# Patient Record
Sex: Male | Born: 1970 | Race: White | Hispanic: No | Marital: Married | Smoking: Never smoker
Health system: Southern US, Community
[De-identification: ages and names within clinical notes are randomized; demographics above are authoritative.]

## PROBLEM LIST (undated history)

## (undated) DIAGNOSIS — G43909 Migraine, unspecified, not intractable, without status migrainosus: Secondary | ICD-10-CM

## (undated) DIAGNOSIS — M5136 Other intervertebral disc degeneration, lumbar region: Secondary | ICD-10-CM

## (undated) DIAGNOSIS — J302 Other seasonal allergic rhinitis: Secondary | ICD-10-CM

## (undated) DIAGNOSIS — K420 Umbilical hernia with obstruction, without gangrene: Secondary | ICD-10-CM

## (undated) DIAGNOSIS — J40 Bronchitis, not specified as acute or chronic: Secondary | ICD-10-CM

## (undated) HISTORY — PX: SHOULDER SURGERY: SHX246

## (undated) HISTORY — DX: Migraine, unspecified, not intractable, without status migrainosus: G43.909

## (undated) HISTORY — DX: Bronchitis, not specified as acute or chronic: J40

## (undated) HISTORY — PX: HAND SURGERY: SHX662

## (undated) HISTORY — PX: ELBOW SURGERY: SHX618

## (undated) HISTORY — DX: Other seasonal allergic rhinitis: J30.2

---

## 1998-05-29 ENCOUNTER — Ambulatory Visit (HOSPITAL_COMMUNITY): Admission: RE | Admit: 1998-05-29 | Discharge: 1998-05-29 | Payer: Self-pay | Admitting: Family Medicine

## 1999-03-09 ENCOUNTER — Emergency Department (HOSPITAL_COMMUNITY): Admission: EM | Admit: 1999-03-09 | Discharge: 1999-03-09 | Payer: Self-pay | Admitting: Emergency Medicine

## 2007-02-27 ENCOUNTER — Emergency Department (HOSPITAL_COMMUNITY): Admission: EM | Admit: 2007-02-27 | Discharge: 2007-02-27 | Payer: Self-pay | Admitting: Emergency Medicine

## 2010-09-03 ENCOUNTER — Ambulatory Visit: Payer: Self-pay | Admitting: Vascular Surgery

## 2010-09-03 ENCOUNTER — Ambulatory Visit
Admission: RE | Admit: 2010-09-03 | Discharge: 2010-09-03 | Payer: Self-pay | Source: Home / Self Care | Admitting: Orthopedic Surgery

## 2014-08-01 ENCOUNTER — Encounter: Payer: Self-pay | Admitting: *Deleted

## 2014-08-01 DIAGNOSIS — J40 Bronchitis, not specified as acute or chronic: Secondary | ICD-10-CM | POA: Insufficient documentation

## 2017-07-22 ENCOUNTER — Other Ambulatory Visit: Payer: Self-pay | Admitting: Family Medicine

## 2017-07-22 ENCOUNTER — Ambulatory Visit
Admission: RE | Admit: 2017-07-22 | Discharge: 2017-07-22 | Disposition: A | Discharge status: Home / Self Care | Payer: BC Managed Care – PPO | Source: Ambulatory Visit | Attending: Family Medicine | Admitting: Family Medicine

## 2017-07-22 DIAGNOSIS — R109 Unspecified abdominal pain: Secondary | ICD-10-CM

## 2017-07-22 MED ORDER — IOPAMIDOL (ISOVUE-300) INJECTION 61%
125.0000 mL | Freq: Once | INTRAVENOUS | Status: AC | PRN
  Administered 2017-07-22: 125 mL via INTRAVENOUS

## 2017-08-31 ENCOUNTER — Ambulatory Visit
Admission: RE | Admit: 2017-08-31 | Discharge: 2017-08-31 | Disposition: A | Discharge status: Home / Self Care | Payer: BC Managed Care – PPO | Source: Ambulatory Visit | Attending: Family Medicine | Admitting: Family Medicine

## 2017-08-31 ENCOUNTER — Other Ambulatory Visit: Payer: Self-pay | Admitting: Family Medicine

## 2017-08-31 DIAGNOSIS — S20219A Contusion of unspecified front wall of thorax, initial encounter: Secondary | ICD-10-CM

## 2017-11-12 ENCOUNTER — Other Ambulatory Visit: Payer: Self-pay | Admitting: Sports Medicine

## 2017-11-12 DIAGNOSIS — M545 Low back pain: Secondary | ICD-10-CM

## 2017-11-12 DIAGNOSIS — M479 Spondylosis, unspecified: Secondary | ICD-10-CM

## 2017-11-16 ENCOUNTER — Ambulatory Visit
Admission: RE | Admit: 2017-11-16 | Discharge: 2017-11-16 | Disposition: A | Discharge status: Home / Self Care | Payer: BC Managed Care – PPO | Source: Ambulatory Visit | Attending: Sports Medicine | Admitting: Sports Medicine

## 2017-11-16 DIAGNOSIS — M545 Low back pain: Secondary | ICD-10-CM

## 2017-11-16 DIAGNOSIS — M479 Spondylosis, unspecified: Secondary | ICD-10-CM

## 2018-07-30 ENCOUNTER — Emergency Department (HOSPITAL_COMMUNITY)
Admission: EM | Admit: 2018-07-30 | Discharge: 2018-07-30 | Disposition: A | Discharge status: Home / Self Care | Payer: BC Managed Care – PPO | Attending: Emergency Medicine | Admitting: Emergency Medicine

## 2018-07-30 ENCOUNTER — Emergency Department (HOSPITAL_COMMUNITY): Payer: BC Managed Care – PPO

## 2018-07-30 ENCOUNTER — Encounter (HOSPITAL_COMMUNITY): Payer: Self-pay

## 2018-07-30 ENCOUNTER — Other Ambulatory Visit: Payer: Self-pay

## 2018-07-30 DIAGNOSIS — K429 Umbilical hernia without obstruction or gangrene: Secondary | ICD-10-CM | POA: Diagnosis not present

## 2018-07-30 HISTORY — DX: Other intervertebral disc degeneration, lumbar region: M51.36

## 2018-07-30 LAB — COMPREHENSIVE METABOLIC PANEL
ALT: 28 U/L (ref 0–44)
AST: 17 U/L (ref 15–41)
Albumin: 4.1 g/dL (ref 3.5–5.0)
Alkaline Phosphatase: 63 U/L (ref 38–126)
Anion gap: 9 (ref 5–15)
BILIRUBIN TOTAL: 1.3 mg/dL — AB (ref 0.3–1.2)
BUN: 18 mg/dL (ref 6–20)
CO2: 25 mmol/L (ref 22–32)
CREATININE: 0.89 mg/dL (ref 0.61–1.24)
Calcium: 9 mg/dL (ref 8.9–10.3)
Chloride: 108 mmol/L (ref 98–111)
Glucose, Bld: 91 mg/dL (ref 70–99)
Potassium: 4 mmol/L (ref 3.5–5.1)
Sodium: 142 mmol/L (ref 135–145)
TOTAL PROTEIN: 7.1 g/dL (ref 6.5–8.1)

## 2018-07-30 LAB — CBC
HCT: 46 % (ref 39.0–52.0)
Hemoglobin: 15.4 g/dL (ref 13.0–17.0)
MCH: 31.2 pg (ref 26.0–34.0)
MCHC: 33.5 g/dL (ref 30.0–36.0)
MCV: 93.3 fL (ref 80.0–100.0)
NRBC: 0 % (ref 0.0–0.2)
Platelets: 249 10*3/uL (ref 150–400)
RBC: 4.93 MIL/uL (ref 4.22–5.81)
RDW: 12.2 % (ref 11.5–15.5)
WBC: 12.9 10*3/uL — AB (ref 4.0–10.5)

## 2018-07-30 LAB — LIPASE, BLOOD: LIPASE: 43 U/L (ref 11–51)

## 2018-07-30 MED ORDER — SODIUM CHLORIDE 0.9 % IJ SOLN
INTRAMUSCULAR | Status: AC
  Filled 2018-07-30: qty 50

## 2018-07-30 MED ORDER — IOPAMIDOL (ISOVUE-300) INJECTION 61%
INTRAVENOUS | Status: AC
  Filled 2018-07-30: qty 100

## 2018-07-30 MED ORDER — IOPAMIDOL (ISOVUE-300) INJECTION 61%
100.0000 mL | Freq: Once | INTRAVENOUS | Status: AC | PRN
  Administered 2018-07-30: 100 mL via INTRAVENOUS

## 2018-07-30 MED ORDER — HYDROMORPHONE HCL 1 MG/ML IJ SOLN
1.0000 mg | Freq: Once | INTRAMUSCULAR | Status: AC
  Administered 2018-07-30: 1 mg via INTRAVENOUS
  Filled 2018-07-30: qty 1

## 2018-07-30 MED ORDER — SODIUM CHLORIDE 0.9 % IV SOLN
INTRAVENOUS | Status: DC
  Administered 2018-07-30: 16:00:00 via INTRAVENOUS

## 2018-07-30 MED ORDER — SODIUM CHLORIDE 0.9 % IV BOLUS
1000.0000 mL | Freq: Once | INTRAVENOUS | Status: AC
  Administered 2018-07-30: 1000 mL via INTRAVENOUS

## 2018-07-30 MED ORDER — ONDANSETRON HCL 4 MG/2ML IJ SOLN
4.0000 mg | Freq: Once | INTRAMUSCULAR | Status: AC
  Administered 2018-07-30: 4 mg via INTRAVENOUS
  Filled 2018-07-30: qty 2

## 2018-07-30 MED ORDER — DOXYCYCLINE HYCLATE 100 MG PO TABS
100.0000 mg | ORAL_TABLET | Freq: Once | ORAL | Status: AC
  Administered 2018-07-30: 100 mg via ORAL
  Filled 2018-07-30: qty 1

## 2018-07-30 MED ORDER — HYDROCODONE-ACETAMINOPHEN 5-325 MG PO TABS: 1.0000 | ORAL_TABLET | Freq: Four times a day (QID) | ORAL | 0 refills | Status: DC | PRN

## 2018-07-30 MED ORDER — DOXYCYCLINE HYCLATE 100 MG PO CAPS: 100.0000 mg | ORAL_CAPSULE | Freq: Two times a day (BID) | ORAL | 0 refills | Status: DC

## 2018-07-30 NOTE — ED Notes (Signed)
Pt reporst abd/umbillical region x 2 days. 8/10 sharp pain. Pt was seen by PCP today and was r/o strangulated hernia. Area is tender to touch, pt is guared, and redness is noted surrounding umbilical. Pt escorted with spouse.

## 2018-07-30 NOTE — ED Notes (Signed)
Patient transported to CT, unable to obtain vitals 

## 2018-07-30 NOTE — Discharge Instructions (Signed)
Take the antibiotic doxycycline as directed.  Call on Monday to follow-up with [facility].  Take pain medicine as needed.  Return for fevers persistent vomiting or spreading of the redness.

## 2018-07-30 NOTE — ED Notes (Signed)
Pt made aware urine specimen is needed.  

## 2018-07-30 NOTE — ED Triage Notes (Signed)
Patient c/o mid abdominal pain and went to see PCP today. Patient ws told to come to the ED for CT scan to rule out possible acute strangulated hernia x 2 days. Patient denies any N/V.

## 2018-07-30 NOTE — ED Notes (Signed)
Pt would like to know if his IV needs to be restarted

## 2018-07-30 NOTE — ED Provider Notes (Signed)
[facility]-EMERGENCY DEPT Provider Note   CSN: [id number] Arrival date & time: 07/30/18  1420     History   Chief Complaint Chief Complaint  Patient presents with  . Abdominal Pain    HPI Howard Miller is a 47 y.o. male.  Patient followed by [facility] physicians.  Was seen earlier today noted to have what was felt to be an periumbilical hernia with redness surrounding it.  Symptoms started about 1 week ago.  Redness just started yesterday or perhaps Wednesday.  No nausea or vomiting no fevers.  Discomfort certainly increased yesterday.     Past Medical History:  Diagnosis Date  . Bronchitis   . DDD (degenerative disc disease), lumbar   . Migraines   . Seasonal allergies     Patient Active Problem List   Diagnosis Date Noted  . Bronchitis     Past Surgical History:  Procedure Laterality Date  . ELBOW SURGERY Left   . HAND SURGERY Right   . SHOULDER SURGERY Bilateral         Home Medications    Prior to Admission medications   Medication Sig Start Date End Date Taking? Authorizing Provider  acetaminophen (TYLENOL) 500 MG tablet Take 1,000 mg by mouth every 6 (six) hours as needed for moderate pain.   Yes [provider]  cyclobenzaprine (FLEXERIL) 10 MG tablet Take 10 mg by mouth 3 (three) times daily as needed for muscle spasms.  05/12/18  Yes [provider]  ibuprofen (ADVIL,MOTRIN) 200 MG tablet Take 800 mg by mouth every 6 (six) hours as needed for moderate pain.   Yes [provider]  traMADol (ULTRAM) 50 MG tablet Take 50-100 mg by mouth every 6 (six) hours as needed for moderate pain.  07/02/18  Yes [provider]  albuterol (PROVENTIL) (2.5 MG/3ML) 0.083% nebulizer solution Take 2.5 mg by nebulization 4 (four) times daily as needed for wheezing or shortness of breath.    [provider]  doxycycline (VIBRAMYCIN) 100 MG capsule Take 1 capsule (100 mg total) by mouth 2 (two) times daily.  07/30/18   Vanetta Mulders, MD  HYDROcodone-acetaminophen (NORCO/VICODIN) 5-325 MG tablet Take 1-2 tablets by mouth every 6 (six) hours as needed for moderate pain. 07/30/18   Vanetta Mulders, MD    Family History Family History  Family history unknown: Yes    Social History Social History   Tobacco Use  . Smoking status: Never Smoker  . Smokeless tobacco: Former Engineer, water Use Topics  . Alcohol use: Yes  . Drug use: No     Allergies   Patient has no known allergies.   Review of Systems Review of Systems  Constitutional: Negative for fever.  HENT: Negative for congestion.   Eyes: Negative for redness and visual disturbance.  Respiratory: Negative for shortness of breath.   Cardiovascular: Negative for chest pain.  Gastrointestinal: Positive for abdominal pain. Negative for diarrhea, nausea and vomiting.  Genitourinary: Negative for dysuria.  Musculoskeletal: Negative for back pain.  Skin: Positive for rash.  Allergic/Immunologic: Negative for immunocompromised state.  Neurological: Negative for syncope.  Hematological: Does not bruise/bleed easily.  Psychiatric/Behavioral: Negative for confusion.     Physical Exam Updated Vital Signs BP 127/76   Pulse 72   Temp 97.9 F (36.6 C) (Oral)   Resp 17   Ht 1.854 m (6\' 1" )   Wt (!) 142.9 kg   SpO2 95%   BMI 41.56 kg/m   Physical Exam  Constitutional: He  appears well-developed and well-nourished. No distress.  HENT:  Head: Normocephalic and atraumatic.  Mouth/Throat: Oropharynx is clear and moist.  Eyes: Pupils are equal, round, and reactive to light. EOM are normal. No scleral icterus.  Cardiovascular: Normal rate and normal heart sounds.  Pulmonary/Chest: Effort normal and breath sounds normal. No respiratory distress.  Abdominal: Soft. He exhibits mass. There is tenderness. A hernia is present.  Patient with swelling around the periumbilical area there appears to be a firm mass at the umbilicus there  is an area of surrounding erythema about 6 cm.  It is spherical in nature.  Nursing note and vitals reviewed.    ED Treatments / Results  Labs (all labs ordered are listed, but only abnormal results are displayed) Labs Reviewed  COMPREHENSIVE METABOLIC PANEL - Abnormal; Notable for the following components:      Result Value   Total Bilirubin 1.3 (*)    All other components within normal limits  CBC - Abnormal; Notable for the following components:   WBC 12.9 (*)    All other components within normal limits  LIPASE, BLOOD  URINALYSIS, ROUTINE W REFLEX MICROSCOPIC    EKG None  Radiology Ct Abdomen Pelvis W Contrast  Result Date: 07/30/2018 CLINICAL DATA:  RIGHT LOWER QUADRANT pain for 2 days. EXAM: CT ABDOMEN AND PELVIS WITH CONTRAST TECHNIQUE: Multidetector CT imaging of the abdomen and pelvis was performed using the standard protocol following bolus administration of intravenous contrast. CONTRAST:  ISOVUE-300 IOPAMIDOL (ISOVUE-300) INJECTION 61% COMPARISON:  CT of the abdomen and pelvis on 07/22/2017 FINDINGS: Lower chest: Heart size is normal. No pericardial effusion or significant coronary artery calcifications. No pulmonary nodules, pleural effusions, or infiltrates. Hepatobiliary: The liver is diffusely low attenuation. No focal liver lesion. Gallbladder is present. Pancreas: Unremarkable. No pancreatic ductal dilatation or surrounding inflammatory changes. Spleen: Normal in size without focal abnormality. Adrenals/Urinary Tract: The adrenal glands are normal in appearance. Stable small cyst in the LOWER pole of the LEFT kidney, measuring 8 millimeters. No hydronephrosis. The ureters are unremarkable. The bladder and visualized portion of the urethra are normal. Stomach/Bowel: The stomach and small bowel loops are normal in appearance. Loops of colon are normal in appearance. The appendix is well seen and has a normal appearance. Vascular/Lymphatic: No significant vascular  findings are present. No enlarged abdominal or pelvic lymph nodes. Reproductive: Prostate is unremarkable. Other: Fat containing paraumbilical hernia is larger compared to prior study, now measuring 4.6 x 6.5 centimeters. There is new inflammatory change of the herniated mesenteric fat. No associated bowel loop herniation. Musculoskeletal: Degenerative changes are seen in the thoracolumbar spine. No suspicious lytic or blastic lesions are identified. IMPRESSION: 1. Inflamed herniated mesenteric fat in the paraumbilical region, new since prior studies. Herniated fat measures 4.6 x 6.5 centimeters. No associated bowel obstruction or herniated bowel. 2. Hepatic steatosis. 3. Normal appendix. Electronically Signed   By: Norva Pavlov M.D.   On: 07/30/2018 17:11    Procedures Procedures (including critical care time)  Medications Ordered in ED Medications  0.9 %  sodium chloride infusion ( Intravenous New Bag/Given 07/30/18 1610)  iopamidol (ISOVUE-300) 61 % injection (has no administration in time range)  sodium chloride 0.9 % injection (has no administration in time range)  sodium chloride 0.9 % bolus 1,000 mL (1,000 mLs Intravenous New Bag/Given 07/30/18 1609)  HYDROmorphone (DILAUDID) injection 1 mg (1 mg Intravenous Given 07/30/18 1606)  ondansetron (ZOFRAN) injection 4 mg (4 mg Intravenous Given 07/30/18 1606)  iopamidol (ISOVUE-300) 61 %  injection 100 mL (100 mLs Intravenous Contrast Given 07/30/18 1645)  doxycycline (VIBRA-TABS) tablet 100 mg (100 mg Oral Given 07/30/18 1940)     Initial Impression / Assessment and Plan / ED Course  I have reviewed the triage vital signs and the nursing notes.  Pertinent labs & imaging results that were available during my care of the patient were reviewed by me and considered in my medical decision making (see chart for details).     CT findings discussed with radiology.  They are certain that is omental fat and not small bowel mesentery that stuck  in the umbilical hernia.  In conversations with Dr. Cliffton Asters as long as it was omental patient can be discharged home on a skin antibiotic they were fine with doxycycline for the next 7 days and they will follow him up in the clinic.  Patient will return for any new or worse symptoms.  No evidence of bowel obstruction.  Final Clinical Impressions(s) / ED Diagnoses   Final diagnoses:  Umbilical hernia without obstruction and without gangrene    ED Discharge Orders         Ordered    doxycycline (VIBRAMYCIN) 100 MG capsule  2 times daily     07/30/18 1946    HYDROcodone-acetaminophen (NORCO/VICODIN) 5-325 MG tablet  Every 6 hours PRN     07/30/18 1949           Vanetta Mulders, MD 07/30/18 2000

## 2018-08-03 ENCOUNTER — Other Ambulatory Visit: Payer: Self-pay | Admitting: Surgery

## 2018-08-04 ENCOUNTER — Other Ambulatory Visit: Payer: Self-pay

## 2018-08-04 ENCOUNTER — Encounter (HOSPITAL_BASED_OUTPATIENT_CLINIC_OR_DEPARTMENT_OTHER): Payer: Self-pay | Admitting: *Deleted

## 2018-08-05 NOTE — H&P (Signed)
Howard Miller Documented: 08/03/2018 1:55 PM Location: [facility] Patient #: [id number] DOB: [birth year] Married / Language: English / Race: White Male   History of Present Illness (Mandrell Vangilder A. Magnus Ivan MD; 08/03/2018 2:13 PM) The patient is a 47 year old male who presents with an umbilical hernia. This patient is sent by the emergency room for symptomatic umbilical hernia. He was seen there on October 11 with an umbilical hernia containing incarcerated omentum. He may have had the hernia for sometime but he only recently noticed it. It may have gotten larger after been constipated. He does not do a lot of heavy lifting. He has had no nausea or vomiting. Because of erythema on the umbilicus, they placed him on antibiotics he reports the erythema resolved. He had a CT scan showing the small fascial defect with a large amount of incarcerated omentum in the hernia and no bowel involvement. He is otherwise without complaints.   Past Surgical History Doristine Devoid, CMA; 08/03/2018 1:55 PM) Oral Surgery  Shoulder Surgery  Bilateral. Vasectomy   Diagnostic Studies History Doristine Devoid, CMA; 08/03/2018 1:55 PM) Colonoscopy  1-5 years ago  Allergies Doristine Devoid, CMA; 08/03/2018 1:55 PM) No Known Drug Allergies [08/03/2018]:  Medication History Doristine Devoid, CMA; 08/03/2018 1:56 PM) Doxycycline Hyclate (100MG  Capsule, Oral) Active. HYDROcodone-Acetaminophen (5-325MG  Tablet, Oral) Active. Medications Reconciled  Social History Doristine Devoid, CMA; 08/03/2018 1:55 PM) Alcohol use  Occasional alcohol use. Caffeine use  Carbonated beverages. No drug use  Tobacco use  Never smoker.  Family History Doristine Devoid, CMA; 08/03/2018 1:55 PM) Arthritis  Mother. Cancer  Mother.  Other Problems Doristine Devoid, CMA; 08/03/2018 1:55 PM) Arthritis  Back Pain  Gastroesophageal Reflux Disease     Review of Systems (Chemira Jones CMA; 08/03/2018 1:55  PM) General Not Present- Appetite Loss, Chills, Fatigue, Fever, Night Sweats, Weight Gain and Weight Loss. Skin Not Present- Change in Wart/Mole, Dryness, Hives, Jaundice, New Lesions, Non-Healing Wounds, Rash and Ulcer. HEENT Not Present- Earache, Hearing Loss, Hoarseness, Nose Bleed, Oral Ulcers, Ringing in the Ears, Seasonal Allergies, Sinus Pain, Sore Throat, Visual Disturbances, Wears glasses/contact lenses and Yellow Eyes. Respiratory Not Present- Bloody sputum, Chronic Cough, Difficulty Breathing, Snoring and Wheezing. Breast Not Present- Breast Mass, Breast Pain, Nipple Discharge and Skin Changes. Cardiovascular Not Present- Chest Pain, Difficulty Breathing Lying Down, Leg Cramps, Palpitations, Rapid Heart Rate, Shortness of Breath and Swelling of Extremities. Gastrointestinal Present- Abdominal Pain. Not Present- Bloating, Bloody Stool, Change in Bowel Habits, Chronic diarrhea, Constipation, Difficulty Swallowing, Excessive gas, Gets full quickly at meals, Hemorrhoids, Indigestion, Nausea, Rectal Pain and Vomiting. Male Genitourinary Not Present- Blood in Urine, Change in Urinary Stream, Frequency, Impotence, Nocturia, Painful Urination, Urgency and Urine Leakage. Musculoskeletal Present- Back Pain, Joint Pain and Joint Stiffness. Not Present- Muscle Pain, Muscle Weakness and Swelling of Extremities. Neurological Not Present- Decreased Memory, Fainting, Headaches, Numbness, Seizures, Tingling, Tremor, Trouble walking and Weakness. Psychiatric Not Present- Anxiety, Bipolar, Change in Sleep Pattern, Depression, Fearful and Frequent crying. Endocrine Not Present- Cold Intolerance, Excessive Hunger, Hair Changes, Heat Intolerance, Hot flashes and New Diabetes. Hematology Not Present- Blood Thinners, Easy Bruising, Excessive bleeding, Gland problems, HIV and Persistent Infections.  Vitals (Chemira Jones CMA; 08/03/2018 1:55 PM) 08/03/2018 1:55 PM Weight: 320.4 lb Height: 73in Body Surface  Area: 2.63 m Body Mass Index: 42.27 kg/m  Pulse: 74 (Regular)  BP: 118/82 (Sitting, Left Arm, Standard)       Physical Exam (Kadee Philyaw A. Magnus Ivan MD; 08/03/2018 2:14 PM) General Mental Status-Alert. General Appearance-Consistent  with stated age. Hydration-Well hydrated. Voice-Normal.  Head and Neck Head-normocephalic, atraumatic with no lesions or palpable masses. Trachea-midline.  Eye Eyeball - Bilateral-Extraocular movements intact. Sclera/Conjunctiva - Bilateral-No scleral icterus.  Chest and Lung Exam Chest and lung exam reveals -quiet, even and easy respiratory effort with no use of accessory muscles and on auscultation, normal breath sounds, no adventitious sounds and normal vocal resonance. Inspection Chest Wall - Normal. Back - normal.  Cardiovascular Cardiovascular examination reveals -normal heart sounds, regular rate and rhythm with no murmurs and normal pedal pulses bilaterally.  Abdomen Inspection Skin - Scar - no surgical scars. Hernias - Ventral - Reducible. Umbilical hernia - Incarcerated. Note: There is a large incarcerated umbilical hernia at the upper edge of the umbilicus. There is no further erythema of the skin. Palpation/Percussion Palpation and Percussion of the abdomen reveal - Soft, Non Tender, No Rebound tenderness, No Rigidity (guarding) and No hepatosplenomegaly. Auscultation Auscultation of the abdomen reveals - Bowel sounds normal.  Neurologic Neurologic evaluation reveals -alert and oriented x 3 with no impairment of recent or remote memory. Mental Status-Normal.  Musculoskeletal Normal Exam - Left-Upper Extremity Strength Normal and Lower Extremity Strength Normal. Normal Exam - Right-Upper Extremity Strength Normal, Lower Extremity Weakness.    Assessment & Plan (Mckinzie Saksa A. Magnus Ivan MD; 08/03/2018 2:15 PM) UMBILICAL HERNIA, INCARCERATED (K42.0) Impression: As this is a symptomatic, incarcerated  umbilical hernia, urgent repair is recommended. I discussed the diagnosis in detail with the patient and his wife. They are eager to proceed with surgery as soon as possible. I discussed the surgical procedure in detail. I discussed the risks which include but not limited to bleeding, infection, use of mesh, injury to surrounding structures, cardiopulmonary issues, DVT, postoperative recovery, etc. They understand and agree to proceed with surgery which will be scheduled ASAP

## 2018-08-05 NOTE — Anesthesia Preprocedure Evaluation (Addendum)
Anesthesia Evaluation  Patient identified by MRN, date of birth, ID band Patient awake    Reviewed: Allergy & Precautions, NPO status , Patient's Chart, lab work & pertinent test results  History of Anesthesia Complications Negative for: history of anesthetic complications  Airway Mallampati: II  TM Distance: >3 FB Neck ROM: Full    Dental no notable dental hx. (+) Dental Advisory Given   Pulmonary neg pulmonary ROS,    Pulmonary exam normal        Cardiovascular negative cardio ROS Normal cardiovascular exam     Neuro/Psych negative neurological ROS     GI/Hepatic negative GI ROS, Neg liver ROS,   Endo/Other  Morbid obesity  Renal/GU negative Renal ROS     Musculoskeletal negative musculoskeletal ROS (+)   Abdominal   Peds  Hematology negative hematology ROS (+)   Anesthesia Other Findings Day of surgery medications reviewed with the patient.  Reproductive/Obstetrics                            Anesthesia Physical Anesthesia Plan  ASA: III  Anesthesia Plan: General   Post-op Pain Management:    Induction: Intravenous  PONV Risk Score and Plan: 3 and Ondansetron, Dexamethasone and Diphenhydramine  Airway Management Planned: LMA  Additional Equipment:   Intra-op Plan:   Post-operative Plan: Extubation in OR  Informed Consent: I have reviewed the patients History and Physical, chart, labs and discussed the procedure including the risks, benefits and alternatives for the proposed anesthesia with the patient or authorized representative who has indicated his/her understanding and acceptance.   Dental advisory given  Plan Discussed with: CRNA, Anesthesiologist and Surgeon  Anesthesia Plan Comments:        Anesthesia Quick Evaluation

## 2018-08-06 ENCOUNTER — Ambulatory Visit (HOSPITAL_BASED_OUTPATIENT_CLINIC_OR_DEPARTMENT_OTHER)
Admission: RE | Admit: 2018-08-06 | Discharge: 2018-08-06 | Disposition: A | Discharge status: Home / Self Care | Payer: BC Managed Care – PPO | Source: Ambulatory Visit | Attending: Surgery | Admitting: Surgery

## 2018-08-06 ENCOUNTER — Ambulatory Visit (HOSPITAL_BASED_OUTPATIENT_CLINIC_OR_DEPARTMENT_OTHER): Payer: BC Managed Care – PPO | Admitting: Anesthesiology

## 2018-08-06 ENCOUNTER — Encounter (HOSPITAL_BASED_OUTPATIENT_CLINIC_OR_DEPARTMENT_OTHER): Payer: Self-pay | Admitting: Anesthesiology

## 2018-08-06 ENCOUNTER — Encounter (HOSPITAL_BASED_OUTPATIENT_CLINIC_OR_DEPARTMENT_OTHER)
Admission: RE | Disposition: A | Discharge status: Home / Self Care | Payer: Self-pay | Source: Ambulatory Visit | Attending: Surgery

## 2018-08-06 ENCOUNTER — Other Ambulatory Visit: Payer: Self-pay

## 2018-08-06 DIAGNOSIS — K420 Umbilical hernia with obstruction, without gangrene: Secondary | ICD-10-CM | POA: Diagnosis present

## 2018-08-06 DIAGNOSIS — Z6841 Body Mass Index (BMI) 40.0 and over, adult: Secondary | ICD-10-CM | POA: Diagnosis not present

## 2018-08-06 HISTORY — PX: UMBILICAL HERNIA REPAIR: SHX196

## 2018-08-06 HISTORY — DX: Umbilical hernia with obstruction, without gangrene: K42.0

## 2018-08-06 HISTORY — PX: INSERTION OF MESH: SHX5868

## 2018-08-06 SURGERY — REPAIR, HERNIA, UMBILICAL, ADULT
Anesthesia: General | Site: Abdomen

## 2018-08-06 MED ORDER — PROPOFOL 500 MG/50ML IV EMUL
INTRAVENOUS | Status: AC
  Filled 2018-08-06: qty 50

## 2018-08-06 MED ORDER — BUPIVACAINE-EPINEPHRINE 0.25% -1:200000 IJ SOLN
INTRAMUSCULAR | Status: AC
  Filled 2018-08-06: qty 1

## 2018-08-06 MED ORDER — CEFAZOLIN SODIUM-DEXTROSE 2-4 GM/100ML-% IV SOLN: 2.0000 g | INTRAVENOUS | Status: DC

## 2018-08-06 MED ORDER — FENTANYL CITRATE (PF) 100 MCG/2ML IJ SOLN
50.0000 ug | INTRAMUSCULAR | Status: DC | PRN
  Administered 2018-08-06: 50 ug via INTRAVENOUS
  Administered 2018-08-06: 100 ug via INTRAVENOUS

## 2018-08-06 MED ORDER — CHLORHEXIDINE GLUCONATE CLOTH 2 % EX PADS: 6.0000 | MEDICATED_PAD | Freq: Once | CUTANEOUS | Status: DC

## 2018-08-06 MED ORDER — ACETAMINOPHEN 500 MG PO TABS
1000.0000 mg | ORAL_TABLET | ORAL | Status: AC
  Administered 2018-08-06: 1000 mg via ORAL

## 2018-08-06 MED ORDER — PROMETHAZINE HCL 25 MG/ML IJ SOLN: 6.2500 mg | INTRAMUSCULAR | Status: DC | PRN

## 2018-08-06 MED ORDER — OXYCODONE HCL 5 MG PO TABS: 5.0000 mg | ORAL_TABLET | Freq: Four times a day (QID) | ORAL | 0 refills | Status: DC | PRN

## 2018-08-06 MED ORDER — DEXAMETHASONE SODIUM PHOSPHATE 10 MG/ML IJ SOLN
INTRAMUSCULAR | Status: AC
  Filled 2018-08-06: qty 1

## 2018-08-06 MED ORDER — FENTANYL CITRATE (PF) 100 MCG/2ML IJ SOLN
INTRAMUSCULAR | Status: AC
  Filled 2018-08-06: qty 2

## 2018-08-06 MED ORDER — CELECOXIB 200 MG PO CAPS
200.0000 mg | ORAL_CAPSULE | ORAL | Status: AC
  Administered 2018-08-06: 200 mg via ORAL

## 2018-08-06 MED ORDER — ACETAMINOPHEN 500 MG PO TABS
ORAL_TABLET | ORAL | Status: AC
  Filled 2018-08-06: qty 2

## 2018-08-06 MED ORDER — LIDOCAINE HCL (PF) 1 % IJ SOLN
INTRAMUSCULAR | Status: AC
  Filled 2018-08-06: qty 30

## 2018-08-06 MED ORDER — OXYCODONE HCL 5 MG PO TABS
5.0000 mg | ORAL_TABLET | Freq: Once | ORAL | Status: AC
  Administered 2018-08-06: 5 mg via ORAL

## 2018-08-06 MED ORDER — DEXAMETHASONE SODIUM PHOSPHATE 4 MG/ML IJ SOLN
INTRAMUSCULAR | Status: DC | PRN
  Administered 2018-08-06: 10 mg via INTRAVENOUS

## 2018-08-06 MED ORDER — LIDOCAINE 2% (20 MG/ML) 5 ML SYRINGE
INTRAMUSCULAR | Status: AC
  Filled 2018-08-06: qty 5

## 2018-08-06 MED ORDER — BUPIVACAINE-EPINEPHRINE (PF) 0.25% -1:200000 IJ SOLN
INTRAMUSCULAR | Status: DC | PRN
  Administered 2018-08-06: 20 mL

## 2018-08-06 MED ORDER — GABAPENTIN 300 MG PO CAPS
300.0000 mg | ORAL_CAPSULE | ORAL | Status: AC
  Administered 2018-08-06: 300 mg via ORAL

## 2018-08-06 MED ORDER — OXYCODONE HCL 5 MG PO TABS
ORAL_TABLET | ORAL | Status: AC
  Filled 2018-08-06: qty 1

## 2018-08-06 MED ORDER — FENTANYL CITRATE (PF) 100 MCG/2ML IJ SOLN
25.0000 ug | INTRAMUSCULAR | Status: DC | PRN
  Administered 2018-08-06 (×3): 50 ug via INTRAVENOUS

## 2018-08-06 MED ORDER — CELECOXIB 200 MG PO CAPS
ORAL_CAPSULE | ORAL | Status: AC
  Filled 2018-08-06: qty 1

## 2018-08-06 MED ORDER — LACTATED RINGERS IV SOLN
INTRAVENOUS | Status: DC
  Administered 2018-08-06 (×3): via INTRAVENOUS

## 2018-08-06 MED ORDER — DEXTROSE 5 % IV SOLN
3.0000 g | INTRAVENOUS | Status: AC
  Administered 2018-08-06: 3 g via INTRAVENOUS

## 2018-08-06 MED ORDER — GABAPENTIN 300 MG PO CAPS
ORAL_CAPSULE | ORAL | Status: AC
  Filled 2018-08-06: qty 1

## 2018-08-06 MED ORDER — CEFAZOLIN SODIUM-DEXTROSE 2-4 GM/100ML-% IV SOLN
INTRAVENOUS | Status: AC
  Filled 2018-08-06: qty 100

## 2018-08-06 MED ORDER — ONDANSETRON HCL 4 MG/2ML IJ SOLN
INTRAMUSCULAR | Status: DC | PRN
  Administered 2018-08-06: 4 mg via INTRAVENOUS

## 2018-08-06 MED ORDER — SCOPOLAMINE 1 MG/3DAYS TD PT72: 1.0000 | MEDICATED_PATCH | Freq: Once | TRANSDERMAL | Status: DC | PRN

## 2018-08-06 MED ORDER — PROPOFOL 10 MG/ML IV BOLUS
INTRAVENOUS | Status: DC | PRN
  Administered 2018-08-06: 200 mg via INTRAVENOUS

## 2018-08-06 MED ORDER — CEFAZOLIN SODIUM-DEXTROSE 1-4 GM/50ML-% IV SOLN
INTRAVENOUS | Status: AC
  Filled 2018-08-06: qty 50

## 2018-08-06 MED ORDER — SODIUM BICARBONATE 4 % IV SOLN
INTRAVENOUS | Status: AC
  Filled 2018-08-06: qty 5

## 2018-08-06 MED ORDER — MIDAZOLAM HCL 2 MG/2ML IJ SOLN
INTRAMUSCULAR | Status: AC
  Filled 2018-08-06: qty 2

## 2018-08-06 MED ORDER — LIDOCAINE HCL (CARDIAC) PF 100 MG/5ML IV SOSY
PREFILLED_SYRINGE | INTRAVENOUS | Status: DC | PRN
  Administered 2018-08-06: 100 mg via INTRAVENOUS

## 2018-08-06 MED ORDER — ONDANSETRON HCL 4 MG/2ML IJ SOLN
INTRAMUSCULAR | Status: AC
  Filled 2018-08-06: qty 2

## 2018-08-06 MED ORDER — MIDAZOLAM HCL 2 MG/2ML IJ SOLN
1.0000 mg | INTRAMUSCULAR | Status: DC | PRN
  Administered 2018-08-06: 2 mg via INTRAVENOUS

## 2018-08-06 SURGICAL SUPPLY — 49 items
ADH SKN CLS APL DERMABOND .7 (GAUZE/BANDAGES/DRESSINGS) ×2
BLADE CLIPPER SURG (BLADE) IMPLANT
BLADE HEX COATED 2.75 (ELECTRODE) ×3 IMPLANT
BLADE SURG 15 STRL LF DISP TIS (BLADE) ×1 IMPLANT
BLADE SURG 15 STRL SS (BLADE) ×3
CANISTER SUCT 1200ML W/VALVE (MISCELLANEOUS) IMPLANT
CHLORAPREP W/TINT 26ML (MISCELLANEOUS) ×3 IMPLANT
COVER BACK TABLE 60X90IN (DRAPES) ×3 IMPLANT
COVER MAYO STAND STRL (DRAPES) ×3 IMPLANT
COVER WAND RF STERILE (DRAPES) IMPLANT
DECANTER SPIKE VIAL GLASS SM (MISCELLANEOUS) IMPLANT
DERMABOND ADVANCED (GAUZE/BANDAGES/DRESSINGS) ×4
DERMABOND ADVANCED .7 DNX12 (GAUZE/BANDAGES/DRESSINGS) ×2 IMPLANT
DRAPE LAPAROTOMY 100X72 PEDS (DRAPES) ×3 IMPLANT
DRAPE UTILITY XL STRL (DRAPES) ×3 IMPLANT
DRSG TEGADERM 2-3/8X2-3/4 SM (GAUZE/BANDAGES/DRESSINGS) IMPLANT
ELECT REM PT RETURN 9FT ADLT (ELECTROSURGICAL) ×3
ELECTRODE REM PT RTRN 9FT ADLT (ELECTROSURGICAL) ×1 IMPLANT
GLOVE BIO SURGEON STRL SZ 6.5 (GLOVE) ×1 IMPLANT
GLOVE BIO SURGEONS STRL SZ 6.5 (GLOVE) ×1
GLOVE BIOGEL PI IND STRL 7.0 (GLOVE) IMPLANT
GLOVE BIOGEL PI INDICATOR 7.0 (GLOVE) ×4
GLOVE SURG SIGNA 7.5 PF LTX (GLOVE) ×3 IMPLANT
GOWN STRL REUS W/ TWL LRG LVL3 (GOWN DISPOSABLE) ×1 IMPLANT
GOWN STRL REUS W/ TWL XL LVL3 (GOWN DISPOSABLE) ×1 IMPLANT
GOWN STRL REUS W/TWL LRG LVL3 (GOWN DISPOSABLE) ×3
GOWN STRL REUS W/TWL XL LVL3 (GOWN DISPOSABLE) ×3
MESH VENTRALEX ST 1-7/10 CRC S (Mesh General) ×2 IMPLANT
NDL HYPO 25X1 1.5 SAFETY (NEEDLE) ×1 IMPLANT
NEEDLE HYPO 25X1 1.5 SAFETY (NEEDLE) ×3 IMPLANT
NS IRRIG 1000ML POUR BTL (IV SOLUTION) IMPLANT
PACK BASIN DAY SURGERY FS (CUSTOM PROCEDURE TRAY) ×3 IMPLANT
PENCIL BUTTON HOLSTER BLD 10FT (ELECTRODE) ×3 IMPLANT
SLEEVE SCD COMPRESS KNEE MED (MISCELLANEOUS) ×3 IMPLANT
SPONGE LAP 4X18 RFD (DISPOSABLE) IMPLANT
SUT MNCRL AB 4-0 PS2 18 (SUTURE) ×3 IMPLANT
SUT NOVA 0 T19/GS 22DT (SUTURE) IMPLANT
SUT NOVA NAB DX-16 0-1 5-0 T12 (SUTURE) IMPLANT
SUT NOVA NAB GS-21 1 T12 (SUTURE) ×2 IMPLANT
SUT VIC AB 2-0 SH 27 (SUTURE)
SUT VIC AB 2-0 SH 27XBRD (SUTURE) IMPLANT
SUT VIC AB 3-0 SH 27 (SUTURE) ×3
SUT VIC AB 3-0 SH 27X BRD (SUTURE) ×1 IMPLANT
SYR CONTROL 10ML LL (SYRINGE) ×3 IMPLANT
TOWEL GREEN STERILE FF (TOWEL DISPOSABLE) ×3 IMPLANT
TOWEL OR NON WOVEN STRL DISP B (DISPOSABLE) ×3 IMPLANT
TUBE CONNECTING 20'X1/4 (TUBING)
TUBE CONNECTING 20X1/4 (TUBING) IMPLANT
YANKAUER SUCT BULB TIP NO VENT (SUCTIONS) IMPLANT

## 2018-08-06 NOTE — Anesthesia Postprocedure Evaluation (Signed)
Anesthesia Post Note  Patient: Howard Miller  Procedure(s) Performed: UMBILICAL HERNIA REPAIR WITH MESH (N/A Abdomen) INSERTION OF MESH (N/A Abdomen)     Patient location during evaluation: PACU Anesthesia Type: General Level of consciousness: sedated Pain management: pain level controlled Vital Signs Assessment: post-procedure vital signs reviewed and stable Respiratory status: spontaneous breathing and respiratory function stable Cardiovascular status: stable Postop Assessment: no apparent nausea or vomiting Anesthetic complications: no    Last Vitals:  Vitals:   08/06/18 0845 08/06/18 0913  BP: 135/77 137/85  Pulse: 66 68  Resp: 19 18  Temp:  36.6 C  SpO2: 96% 98%    Last Pain:  Vitals:   08/06/18 0913  TempSrc:   PainSc: 7                  Shaneisha Burkel DANIEL

## 2018-08-06 NOTE — Interval H&P Note (Signed)
History and Physical Interval Note:no change in H and P  08/06/2018 7:06 AM  Howard Miller  has presented today for surgery, with the diagnosis of INCARCERATED UMBILICAL HERNIA  The various methods of treatment have been discussed with the patient and family. After consideration of risks, benefits and other options for treatment, the patient has consented to  Procedure(s): UMBILICAL HERNIA REPAIR WITH MESH (N/A) INSERTION OF MESH (N/A) as a surgical intervention .  The patient's history has been reviewed, patient examined, no change in status, stable for surgery.  I have reviewed the patient's chart and labs.  Questions were answered to the patient's satisfaction.     [name] A

## 2018-08-06 NOTE — Anesthesia Procedure Notes (Signed)
Procedure Name: LMA Insertion Date/Time: 08/06/2018 7:35 AM Performed by: Gar Gibbon, CRNA Pre-anesthesia Checklist: Patient identified, Emergency Drugs available, Suction available and Patient being monitored Patient Re-evaluated:Patient Re-evaluated prior to induction Oxygen Delivery Method: Circle system utilized Preoxygenation: Pre-oxygenation with 100% oxygen Induction Type: IV induction Ventilation: Mask ventilation without difficulty LMA: LMA inserted LMA Size: 5.0 Number of attempts: 1 Airway Equipment and Method: Bite block Placement Confirmation: positive ETCO2 Tube secured with: Tape Dental Injury: Teeth and Oropharynx as per pre-operative assessment

## 2018-08-06 NOTE — Discharge Instructions (Signed)
No Tylenol products until after 1pm today  Post Anesthesia Home Care Instructions  Activity: Get plenty of rest for the remainder of the day. A responsible individual must stay with you for 24 hours following the procedure.  For the next 24 hours, DO NOT: -Drive a car -Advertising copywriter -Drink alcoholic beverages -Take any medication unless instructed by your physician -Make any legal decisions or sign important papers.  Meals: Start with liquid foods such as gelatin or soup. Progress to regular foods as tolerated. Avoid greasy, spicy, heavy foods. If nausea and/or vomiting occur, drink only clear liquids until the nausea and/or vomiting subsides. Call your physician if vomiting continues.  Special Instructions/Symptoms: Your throat may feel dry or sore from the anesthesia or the breathing tube placed in your throat during surgery. If this causes discomfort, gargle with warm salt water. The discomfort should disappear within 24 hours.  If you had a scopolamine patch placed behind your ear for the management of post- operative nausea and/or vomiting:  1. The medication in the patch is effective for 72 hours, after which it should be removed.  Wrap patch in a tissue and discard in the trash. Wash hands thoroughly with soap and water. 2. You may remove the patch earlier than 72 hours if you experience unpleasant side effects which may include dry mouth, dizziness or visual disturbances. 3. Avoid touching the patch. Wash your hands with soap and water after contact with the patch.   CC[facility] _______[facility]  UMBILICAL OR INGUINAL HERNIA REPAIR: POST OP INSTRUCTIONS  Always review your discharge instruction sheet given to you by the facility where your surgery was performed. IF YOU HAVE DISABILITY OR FAMILY LEAVE FORMS, YOU MUST BRING THEM TO THE OFFICE FOR PROCESSING.   DO NOT GIVE THEM TO YOUR DOCTOR.  1. A  prescription for pain medication may be given to you upon  discharge.  Take your pain medication as prescribed, if needed.  If narcotic pain medicine is not needed, then you may take acetaminophen (Tylenol) or ibuprofen (Advil) as needed. 2. Take your usually prescribed medications unless otherwise directed. If you need a refill on your pain medication, please contact your pharmacy.  They will contact our office to request authorization. Prescriptions will not be filled after 5 pm or on week-ends. 3. You should follow a light diet the first 24 hours after arrival home, such as soup and crackers, etc.  Be sure to include lots of fluids daily.  Resume your normal diet the day after surgery. 4.Most patients will experience some swelling and bruising around the umbilicus or in the groin and scrotum.  Ice packs and reclining will help.  Swelling and bruising can take several days to resolve.  6. It is common to experience some constipation if taking pain medication after surgery.  Increasing fluid intake and taking a stool softener (such as Colace) will usually help or prevent this problem from occurring.  A mild laxative (Milk of Magnesia or Miralax) should be taken according to package directions if there are no bowel movements after 48 hours. 7. Unless discharge instructions indicate otherwise, you may remove your bandages 24-48 hours after surgery, and you may shower at that time.  You may have steri-strips (small skin tapes) in place directly over the incision.  These strips should be left on the skin for 7-10 days.  If your surgeon used skin glue on the incision, you may shower in 24 hours.  The glue will flake off over  the next 2-3 weeks.  Any sutures or staples will be removed at the office during your follow-up visit. 8. ACTIVITIES:  You may resume regular (light) daily activities beginning the next day--such as daily self-care, walking, climbing stairs--gradually increasing activities as tolerated.  You may have sexual intercourse when it is comfortable.   Refrain from any heavy lifting or straining until approved by your doctor.  a.You may drive when you are no longer taking prescription pain medication, you can comfortably wear a seatbelt, and you can safely maneuver your car and apply brakes. b.RETURN TO WORK:   _____________________________________________  9.You should see your doctor in the office for a follow-up appointment approximately 2-3 weeks after your surgery.  Make sure that you call for this appointment within a day or two after you arrive home to insure a convenient appointment time. 10.OTHER INSTRUCTIONS: __NO LIFTING MORE THAN 15 TO 20 POUNDS FOR 4 WEEKS ICE PACK, TYLENOL, IBUPROFEN ALSO FOR PAIN OK TO SHOWER STARTING TOMORROW.  OK FOR BATH/HOT TUB NEXT SATURDAY_______________________    _____________________________________  WHEN TO CALL YOUR DOCTOR: 1. Fever over 101.0 2. Inability to urinate 3. Nausea and/or vomiting 4. Extreme swelling or bruising 5. Continued bleeding from incision. 6. Increased pain, redness, or drainage from the incision  The clinic staff is available to answer your questions during regular business hours.  Please dont hesitate to call and ask to speak to one of the nurses for clinical concerns.  If you have a medical emergency, go to the nearest emergency room or call 911.  A surgeon from [facility] is always on call at the hospital   [address], [address], [place], [place]  [address] ?  [address] [address], [place], [place]   [address] [phone] ? [phone] ? FAX [phone] Web site: [email or web address]

## 2018-08-06 NOTE — Op Note (Signed)
UMBILICAL HERNIA REPAIR WITH MESH, INSERTION OF MESH  Procedure Note  Howard Miller 08/06/2018   Pre-op Diagnosis: INCARCERATED UMBILICAL HERNIA     Post-op Diagnosis: same  Procedure(s): UMBILICAL HERNIA REPAIR WITH MESH INSERTION OF MESH  Surgeon(s): Abigail Miyamoto, MD  Anesthesia: General  Staff:  Circulator: Pablo Ledger, RN Scrub Person: Smith Robert, CST  Estimated Blood Loss: Minimal               Findings: The patient was found to have an umbilical hernia with a fascial defect which was approximately 1 cm in size.  Several inches of omentum were incarcerated in the hernia.  The hernia was repaired with a 4.3 cm round Prolene ventral patch  Procedure: The patient was brought to the operating room and identified as the correct patient.  He was placed supine on the operating room table and general anesthesia was induced.  His abdomen was then prepped and draped in usual sterile fashion.  I anesthetized the skin at the upper edge of the umbilicus with Marcaine.  I then made a vertical incision with a scalpel.  I took this to the hernia sac.  It was a large hernia sac containing a moderate amount of omentum.  I excised the sac.  I had to excise part of the omentum and there was able to reduce the rest back into the abdominal cavity.  The actual fascial defect was only 1 cm in size.  A 4.3 cm round ventral patch was brought to the field.  I placed it through the fascial opening and then pulled it up against the peritoneum with the ties.  The mesh was sewn in place circumferentially with interrupted #1 Novafil sutures.  I then cut the ties and closed the fascia over the top of the mesh with a figure-of-eight #1 Novafil suture.  Wide coverage of the fascial defect appeared to be achieved.  I anesthetized the surrounding fascia and subcutaneous tissue with Marcaine.  We irrigated the wound with saline.  I then closed the subcutaneous tissue with interrupted 3-0 Vicryl sutures  and closed the skin with a running 4-0 Monocryl.  Dermabond was then applied.  The patient tolerated the procedure well.  All the counts were correct at the end of the procedure.  The patient was then extubated in the operating room and taken in a stable condition to the recovery room.          Howard Miller A   Date: 08/06/2018  Time: 8:15 AM

## 2018-08-06 NOTE — Transfer of Care (Signed)
Immediate Anesthesia Transfer of Care Note  Patient: Howard Miller  Procedure(s) Performed: UMBILICAL HERNIA REPAIR WITH MESH (N/A Abdomen) INSERTION OF MESH (N/A Abdomen)  Patient Location: PACU  Anesthesia Type:General  Level of Consciousness: awake, sedated and responds to stimulation  Airway & Oxygen Therapy: Patient Spontanous Breathing and Patient connected to face mask oxygen  Post-op Assessment: Report given to RN and Post -op Vital signs reviewed and stable  Post vital signs: Reviewed and stable  Last Vitals:  Vitals Value Taken Time  BP 140/92 08/06/2018  8:17 AM  Temp    Pulse 69 08/06/2018  8:18 AM  Resp 9 08/06/2018  8:18 AM  SpO2 97 % 08/06/2018  8:18 AM  Vitals shown include unvalidated device data.  Last Pain:  Vitals:   08/06/18 0656  TempSrc: Oral         Complications: No apparent anesthesia complications

## 2018-08-09 ENCOUNTER — Encounter (HOSPITAL_BASED_OUTPATIENT_CLINIC_OR_DEPARTMENT_OTHER): Payer: Self-pay | Admitting: Surgery

## 2020-07-16 ENCOUNTER — Emergency Department (HOSPITAL_COMMUNITY): Payer: BC Managed Care – PPO

## 2020-07-16 ENCOUNTER — Inpatient Hospital Stay (HOSPITAL_COMMUNITY)
Admission: EM | Admit: 2020-07-16 | Discharge: 2020-07-19 | DRG: 917 | Disposition: A | Discharge status: Home / Self Care | Payer: BC Managed Care – PPO | Attending: Family Medicine | Admitting: Family Medicine

## 2020-07-16 ENCOUNTER — Encounter (HOSPITAL_COMMUNITY): Payer: Self-pay

## 2020-07-16 ENCOUNTER — Other Ambulatory Visit: Payer: Self-pay

## 2020-07-16 DIAGNOSIS — R7401 Elevation of levels of liver transaminase levels: Secondary | ICD-10-CM | POA: Diagnosis present

## 2020-07-16 DIAGNOSIS — J810 Acute pulmonary edema: Secondary | ICD-10-CM | POA: Diagnosis present

## 2020-07-16 DIAGNOSIS — I10 Essential (primary) hypertension: Secondary | ICD-10-CM | POA: Diagnosis present

## 2020-07-16 DIAGNOSIS — Z6838 Body mass index (BMI) 38.0-38.9, adult: Secondary | ICD-10-CM

## 2020-07-16 DIAGNOSIS — R739 Hyperglycemia, unspecified: Secondary | ICD-10-CM | POA: Diagnosis present

## 2020-07-16 DIAGNOSIS — IMO0002 Reserved for concepts with insufficient information to code with codable children: Secondary | ICD-10-CM | POA: Diagnosis present

## 2020-07-16 DIAGNOSIS — M5136 Other intervertebral disc degeneration, lumbar region: Secondary | ICD-10-CM | POA: Diagnosis present

## 2020-07-16 DIAGNOSIS — T481X1A Poisoning by skeletal muscle relaxants [neuromuscular blocking agents], accidental (unintentional), initial encounter: Secondary | ICD-10-CM | POA: Diagnosis not present

## 2020-07-16 DIAGNOSIS — M6282 Rhabdomyolysis: Secondary | ICD-10-CM | POA: Diagnosis present

## 2020-07-16 DIAGNOSIS — G8929 Other chronic pain: Secondary | ICD-10-CM | POA: Diagnosis present

## 2020-07-16 DIAGNOSIS — R4182 Altered mental status, unspecified: Secondary | ICD-10-CM | POA: Diagnosis not present

## 2020-07-16 DIAGNOSIS — F10959 Alcohol use, unspecified with alcohol-induced psychotic disorder, unspecified: Secondary | ICD-10-CM

## 2020-07-16 DIAGNOSIS — Z23 Encounter for immunization: Secondary | ICD-10-CM

## 2020-07-16 DIAGNOSIS — Z20822 Contact with and (suspected) exposure to covid-19: Secondary | ICD-10-CM | POA: Diagnosis present

## 2020-07-16 DIAGNOSIS — E669 Obesity, unspecified: Secondary | ICD-10-CM | POA: Diagnosis present

## 2020-07-16 DIAGNOSIS — T50901A Poisoning by unspecified drugs, medicaments and biological substances, accidental (unintentional), initial encounter: Secondary | ICD-10-CM | POA: Diagnosis not present

## 2020-07-16 DIAGNOSIS — R339 Retention of urine, unspecified: Secondary | ICD-10-CM | POA: Diagnosis not present

## 2020-07-16 DIAGNOSIS — G92 Toxic encephalopathy: Secondary | ICD-10-CM | POA: Diagnosis present

## 2020-07-16 LAB — CBC WITH DIFFERENTIAL/PLATELET
Abs Immature Granulocytes: 0.14 10*3/uL — ABNORMAL HIGH (ref 0.00–0.07)
Basophils Absolute: 0.1 10*3/uL (ref 0.0–0.1)
Basophils Relative: 0 %
Eosinophils Absolute: 0 10*3/uL (ref 0.0–0.5)
Eosinophils Relative: 0 %
HCT: 46.7 % (ref 39.0–52.0)
Hemoglobin: 15 g/dL (ref 13.0–17.0)
Immature Granulocytes: 1 %
Lymphocytes Relative: 10 %
Lymphs Abs: 2 10*3/uL (ref 0.7–4.0)
MCH: 28.8 pg (ref 26.0–34.0)
MCHC: 32.1 g/dL (ref 30.0–36.0)
MCV: 89.8 fL (ref 80.0–100.0)
Monocytes Absolute: 2.1 10*3/uL — ABNORMAL HIGH (ref 0.1–1.0)
Monocytes Relative: 10 %
Neutro Abs: 16.3 10*3/uL — ABNORMAL HIGH (ref 1.7–7.7)
Neutrophils Relative %: 79 %
Platelets: 266 10*3/uL (ref 150–400)
RBC: 5.2 MIL/uL (ref 4.22–5.81)
RDW: 13.9 % (ref 11.5–15.5)
WBC: 20.6 10*3/uL — ABNORMAL HIGH (ref 4.0–10.5)
nRBC: 0 % (ref 0.0–0.2)

## 2020-07-16 LAB — COMPREHENSIVE METABOLIC PANEL
ALT: 65 U/L — ABNORMAL HIGH (ref 0–44)
AST: 169 U/L — ABNORMAL HIGH (ref 15–41)
Albumin: 3.9 g/dL (ref 3.5–5.0)
Alkaline Phosphatase: 76 U/L (ref 38–126)
Anion gap: 14 (ref 5–15)
BUN: 23 mg/dL — ABNORMAL HIGH (ref 6–20)
CO2: 20 mmol/L — ABNORMAL LOW (ref 22–32)
Calcium: 9.9 mg/dL (ref 8.9–10.3)
Chloride: 103 mmol/L (ref 98–111)
Creatinine, Ser: 1.07 mg/dL (ref 0.61–1.24)
GFR calc Af Amer: >60 mL/min (ref >60)
GFR calc non Af Amer: >60 mL/min (ref >60)
Glucose, Bld: 137 mg/dL — ABNORMAL HIGH (ref 70–99)
Potassium: 4.6 mmol/L (ref 3.5–5.1)
Sodium: 137 mmol/L (ref 135–145)
Total Bilirubin: 1.1 mg/dL (ref 0.3–1.2)
Total Protein: 7.3 g/dL (ref 6.5–8.1)

## 2020-07-16 LAB — I-STAT VENOUS BLOOD GAS, ED
Acid-Base Excess: 0 mmol/L (ref 0.0–2.0)
Bicarbonate: 23.5 mmol/L (ref 20.0–28.0)
Calcium, Ion: 1.17 mmol/L (ref 1.15–1.40)
HCT: 45 % (ref 39.0–52.0)
Hemoglobin: 15.3 g/dL (ref 13.0–17.0)
O2 Saturation: 81 %
Potassium: 4 mmol/L (ref 3.5–5.1)
Sodium: 138 mmol/L (ref 135–145)
TCO2: 25 mmol/L (ref 22–32)
pCO2, Ven: 34.1 mmHg — ABNORMAL LOW (ref 44.0–60.0)
pH, Ven: 7.446 — ABNORMAL HIGH (ref 7.250–7.430)
pO2, Ven: 43 mmHg (ref 32.0–45.0)

## 2020-07-16 LAB — RESPIRATORY PANEL BY RT PCR (FLU A&B, COVID)
Influenza A by PCR: NEGATIVE
Influenza B by PCR: NEGATIVE
SARS Coronavirus 2 by RT PCR: NEGATIVE

## 2020-07-16 LAB — SALICYLATE LEVEL: Salicylate Lvl: <7 mg/dL — ABNORMAL LOW (ref 7.0–30.0)

## 2020-07-16 LAB — ETHANOL: Alcohol, Ethyl (B): <10 mg/dL (ref <10)

## 2020-07-16 LAB — ACETAMINOPHEN LEVEL: Acetaminophen (Tylenol), Serum: <10 ug/mL — ABNORMAL LOW (ref 10–30)

## 2020-07-16 LAB — AMMONIA: Ammonia: 28 umol/L (ref 9–35)

## 2020-07-16 LAB — TSH: TSH: 2.809 u[IU]/mL (ref 0.350–4.500)

## 2020-07-16 LAB — MAGNESIUM: Magnesium: 1.9 mg/dL (ref 1.7–2.4)

## 2020-07-16 MED ORDER — PIPERACILLIN-TAZOBACTAM 3.375 G IVPB 30 MIN
3.3750 g | Freq: Once | INTRAVENOUS | Status: AC
  Administered 2020-07-16: 3.375 g via INTRAVENOUS
  Filled 2020-07-16: qty 50

## 2020-07-16 MED ORDER — VANCOMYCIN HCL 2000 MG/400ML IV SOLN
2000.0000 mg | Freq: Once | INTRAVENOUS | Status: AC
  Administered 2020-07-17: 2000 mg via INTRAVENOUS
  Filled 2020-07-16: qty 400

## 2020-07-16 NOTE — ED Triage Notes (Signed)
Pt brought in for AMS for over 24hrs, last well known was last night. Pt has hx of degenerative disk disease- wife told EMS pt took 4 cyclobenzaprine pills (unknown mg) w alcohol. Per wife- he was still asleep when she left this am for work. Upon returning from work for a few hours, pt was very lethargic, not speaking or answering questions. Pt was mildly combative w EMS. In ED pt opened eyes w sternal rub, but did not answer questions and immediately went back to sleep. GCS 8

## 2020-07-16 NOTE — H&P (Addendum)
Family Medicine Teaching [facility] Admission History and Physical Service Pager: [phone]  Patient name: Howard Miller Medical record number: [id number] Date of birth: June 08, 1971 Age: 49 y.o. Gender: male  Primary Care Provider: Farris Has, MD Consultants: None Code Status: Full which was confirmed by patient's wife if patient unable to confirm. Preferred Emergency Contact: Wife  Chief Complaint: AMS  Assessment and Plan: Howard Miller is a 49 y.o. male presenting with AMS. PMH is significant for Chronic Pain with Degenerative Disc disease.    AMS/Accidental Overdose Patient presented very somnolent like due to overdose of Cyclobenzaprine.  Arousable to deep sternal rub and occasionally to voice.  Gives only occasional one word responses.  Is aware he is in hospital.  Has occasional jerking movements most noticeable in left lower limb.  Patellar eflexes 3+ bilaterally.  Hemodynamically stable.  Satting in 90's on 2L [place].  Likely due to accidnetal Cyclobenzaprine Overdose.  It is difficult to determine the number of cyclobenzaprine patient consumed.  Patient's wife reports she was told for but was unsure of how many were in the bottle prior to him taking the medications.  Concern for aspirational pneumonia given patient's state and difficulty swallowing per wife.  Patient was given Zosyn and vancomycin in the ED due to this.  I think it is more likely that elevated white count is related to neutrophilic demargination.  Ammonia-28.  TSH-nomral.  Ethanol, Acetominophen, Salicylate negative.  Venous blood gas normal.  Need to rule out other drug intoxication. UA Rapid Drug screen pending.  EKG pending.  Head CT negative for any acute intracranial abnormalities.  Brain MRI has been ordered.  Less likely hemmorhagic stroke.  Need to rule out Infarction.    Spoke with poison control in regards to recs for Cyclobenzaprine overdose.   - Admit to telemetry with attending Dr Manson Passey - Continue to  monitor changes in mental status - Continuous caridac monitoring - Continuous Pulse Ox - Per posion control, if QRS>120 give Sodium 1-90mEq per kg as a push.  Repeat EKG after to see if QRS narrowing, continue to f/u with poison cotnrol - Monitor Temp for hyperthermia, cooling measures if neccesary - Monitor urine output, strict I's/O's  - f/u UA, UDS - f/u EKG - f/u MRI -N.p.o. at this time  -Given elevated CK restarting maintenance IV fluids at 125 mL/h -Monitor urine output and complete bladder scans  Concern for Aspirational Pneumonia Wife reports patient had issues with drinking water yesterday.  WBC elevated at 20.  Chest X-Ray shows low volumes and atelectatic change.  Additional features consistent with mild CHF/volume overload with pulmonary congestion, and hazy interstitial opacities suggestive of interstitial edema.  Blood cultures obtained.  Patient was given 1 dose of IV Vanc and Zosyn in the emergency department.  Pulmonary findings were consistent with volume overload/CSF so we will discontinue the IV antibiotics this time. -Discontinue Vanco and Zosyn -Monitor for signs and symptoms of pneumonia -Echocardiogram ordered - f/u BCx  Hypertension Patient has history of hypertension.  Not currently on any medications.  SBP 120-140's. - Continue to monitor BP  Pre-Diabetes Patient's wife indicates patient is Prediabetic.  Not currently on any medications.  Blood glucose on admission was 136.   - Obtain A1C  Transaminitis ALT- 65.  ALT-169.  Elevation coud be due to alcohol use.  Also could be due to Pulmonary congestion. -Repeat Liver labs in AM per poison control to see if improving  FEN/GI: Holding IV fluids given pulmonary congestion Prophylaxis:  Lovenox 70  Disposition: Tele  History of Present Illness:  Howard Miller is a 49 y.o. male presenting with AMS.  Patient minimally responsive to questions.  Spoke with patient's wife on phone.  Indicated previous night  patient was drinking while watching football game.  Indicates patient took flexeril and slept on couch.  When awoke in the morning he was somnolent and had episode of emesis and enuresis overnight.  Wife went to work and returned to eveniong to find patient on different couch but largely somnolent, not very responsive to questioning.  Indicates patient told her he had takne 4 Flexeril night before but unknown in patient took more or my have taken some during afternoon while wife was at work  Patient has chronic pain due to lumbar degenerative disc disease, and usually will occasionally take 1 flexeril at night to help sleep.  Takes tylenol and Ibuprofen occasionally.  Wife indicates back pain has been much worse recently due to long car rides on recent trips.  Sees specialist for "nerve therapy" every 6 months that is very helpful with back pain with last "treatment" in March of this year.   Review Of Systems: Per HPI with the following additions:  Review of Systems  Constitutional: Positive for activity change. Negative for chills and fever.  HENT: Positive for trouble swallowing. Negative for facial swelling.   Respiratory: Negative for cough and choking.   Cardiovascular: Negative for leg swelling.  Gastrointestinal: Positive for vomiting.  Genitourinary: Positive for enuresis.  Musculoskeletal: Positive for back pain.  Skin: Positive for color change.       Wife indicates patient has had this chronically  Neurological: Positive for speech difficulty. Negative for syncope.     Patient Active Problem List   Diagnosis Date Noted  . Bronchitis     Past Medical History: Past Medical History:  Diagnosis Date  . Bronchitis   . DDD (degenerative disc disease), lumbar    low back  . Incarcerated umbilical hernia   . Migraines   . Seasonal allergies     Past Surgical History: Past Surgical History:  Procedure Laterality Date  . ELBOW SURGERY Left   . HAND SURGERY Right   .  INSERTION OF MESH N/A 08/06/2018   Procedure: INSERTION OF MESH;  Surgeon: Abigail Miyamoto, MD;  Location: [facility];  Service: General;  Laterality: N/A;  . SHOULDER SURGERY Bilateral   . UMBILICAL HERNIA REPAIR N/A 08/06/2018   Procedure: UMBILICAL HERNIA REPAIR WITH MESH;  Surgeon: Abigail Miyamoto, MD;  Location: [facility];  Service: General;  Laterality: N/A;    Social History: Social History   Tobacco Use  . Smoking status: Never Smoker  . Smokeless tobacco: Former Clinical biochemist  . Vaping Use: Never used  Substance Use Topics  . Alcohol use: Yes    Comment: social  . Drug use: No   Additional social history:  Please also refer to relevant sections of EMR.  Family History: Family History  Family history unknown: Yes   No family history of any cancer  Allergies and Medications: No Known Allergies No current facility-administered medications on file prior to encounter.   Current Outpatient Medications on File Prior to Encounter  Medication Sig Dispense Refill  . acetaminophen (TYLENOL) 500 MG tablet Take 1,000 mg by mouth every 6 (six) hours as needed for moderate pain.    . cyclobenzaprine (FLEXERIL) 10 MG tablet Take 10 mg by mouth 3 (three) times daily as  needed for muscle spasms.   1  . ibuprofen (ADVIL,MOTRIN) 200 MG tablet Take 800 mg by mouth every 6 (six) hours as needed for moderate pain.      Objective: BP (!) 142/92 (BP Location: Left Arm)   Pulse (!) 112   Temp 98.3 F (36.8 C) (Oral)   Resp (!) 29   Ht 6\' 2"  (1.88 m)   Wt 136.1 kg   SpO2 96%   BMI 38.52 kg/m   Exam: Physical Exam Constitutional:      General: He is not in acute distress.    Appearance: Normal appearance. He is not ill-appearing or diaphoretic.  HENT:     Head: Normocephalic and atraumatic.     Mouth/Throat:     Mouth: Mucous membranes are moist.  Cardiovascular:     Rate and Rhythm: Normal rate and regular rhythm.     Pulses: Normal  pulses.  Pulmonary:     Effort: Pulmonary effort is normal.  Abdominal:     General: Abdomen is flat.     Palpations: Abdomen is soft.  Skin:    General: Skin is warm.     Capillary Refill: Capillary refill takes less than 2 seconds.  Neurological:     Mental Status: He is lethargic.     GCS: GCS eye subscore is 3. GCS verbal subscore is 4. GCS motor subscore is 4.     Deep Tendon Reflexes:     Reflex Scores:      Patellar reflexes are 3+ on the right side and 3+ on the left side.    Comments: Arousable to deep sternal rub and occasionally to voice.  Gives only occasional one word responses.  Is aware he is in hospital.  Has occasional jerking movements most noticeable in left lower limb.      Labs and Imaging: CBC BMET  Recent Labs  Lab 07/16/20 2125 07/16/20 2125 07/16/20 2236  WBC 20.6*  --   --   HGB 15.0   < > 15.3  HCT 46.7   < > 45.0  PLT 266  --   --    < > = values in this interval not displayed.   Recent Labs  Lab 07/16/20 2125 07/16/20 2125 07/16/20 2236  NA 137   < > 138  K 4.6   < > 4.0  CL 103  --   --   CO2 20*  --   --   BUN 23*  --   --   CREATININE 1.07  --   --   GLUCOSE 137*  --   --   CALCIUM 9.9  --   --    < > = values in this interval not displayed.     EKG: Pending  07/18/20, MD 07/16/2020, 11:29 PM PGY-1, [facility] FPTS Intern pager: [phone], text pages welcome  FPTS Upper-Level Resident Addendum   I have independently interviewed and examined the patient. I have discussed the above with the original author and agree with their documentation. My edits for correction/addition/clarification are in blue.Please see also any attending notes.   [phone], MD PGY-2, [facility] 07/17/2020 7:58 AM  FPTS Service pager: [phone] (text pages welcome through [facility])

## 2020-07-16 NOTE — ED Provider Notes (Signed)
[facility] EMERGENCY DEPARTMENT Provider Note   CSN: [id number] Arrival date & time: 07/16/20  2045     History Chief Complaint  Patient presents with  . Altered Mental Status    Howard Miller is a 49 y.o. male with history of chronic pain secondary to degenerative disc disease who presents with altered mental status.  Per his wife, patient had a lot to drink last night and took 4 cyclobenzaprine 10 mg before bed for his back pain.  He usually takes 1 cyclobenzaprine, and even this makes him groggy for a very long time.  He was difficult to wake when she left for work this morning, and on arrival back home, she found he was still extremely sleepy and hard to wake, although he did move from his bed to the couch at some point while she was at work.  She found him with vomit on his face lying on his side.  Denies fever or infectious symptoms prior to this.  She is not aware of any other sedating medications in the house that he has access to.  Blood glucose 130s with EMS.  Patient has frequent twitches and waxing waning mental status, where he will sometimes arouse spontaneously but answers questions with slurred speech, then go back to sleep.   Altered Mental Status Presenting symptoms: lethargy and partial responsiveness   Severity:  Severe Most recent episode:  Today Duration:  1 day Timing:  Constant Progression:  Unchanged Chronicity:  New Context: alcohol use and not taking medications as prescribed   Context: not recent illness        Past Medical History:  Diagnosis Date  . Bronchitis   . DDD (degenerative disc disease), lumbar    low back  . Incarcerated umbilical hernia   . Migraines   . Seasonal allergies     Patient Active Problem List   Diagnosis Date Noted  . Bronchitis     Past Surgical History:  Procedure Laterality Date  . ELBOW SURGERY Left   . HAND SURGERY Right   . INSERTION OF MESH N/A 08/06/2018   Procedure: INSERTION OF MESH;   Surgeon: Abigail Miyamoto, MD;  Location: [facility];  Service: General;  Laterality: N/A;  . SHOULDER SURGERY Bilateral   . UMBILICAL HERNIA REPAIR N/A 08/06/2018   Procedure: UMBILICAL HERNIA REPAIR WITH MESH;  Surgeon: Abigail Miyamoto, MD;  Location: [facility];  Service: General;  Laterality: N/A;       Family History  Family history unknown: Yes    Social History   Tobacco Use  . Smoking status: Never Smoker  . Smokeless tobacco: Former Clinical biochemist  . Vaping Use: Never used  Substance Use Topics  . Alcohol use: Yes    Comment: social  . Drug use: No    Home Medications Prior to Admission medications   Medication Sig Start Date End Date Taking? Authorizing Provider  acetaminophen (TYLENOL) 500 MG tablet Take 1,000 mg by mouth every 6 (six) hours as needed for moderate pain.   Yes [provider]  cyclobenzaprine (FLEXERIL) 10 MG tablet Take 10 mg by mouth 3 (three) times daily as needed for muscle spasms.  05/12/18  Yes [provider]  ibuprofen (ADVIL,MOTRIN) 200 MG tablet Take 800 mg by mouth every 6 (six) hours as needed for moderate pain.   Yes [provider]    Allergies    Patient has no known allergies.  Review of Systems  Review of Systems  Unable to perform ROS: Mental status change    Physical Exam Updated Vital Signs BP (!) 142/92 (BP Location: Left Arm)   Pulse (!) 112   Temp 98.3 F (36.8 C) (Oral)   Resp (!) 29   Ht 6\' 2"  (1.88 m)   Wt 136.1 kg   SpO2 96%   BMI 38.52 kg/m   Physical Exam Constitutional:      General: He is not in acute distress.    Appearance: He is obese.     Comments: Somnolent, occasionally arousing spontaneously.   HENT:     Head: Normocephalic and atraumatic.  Eyes:     Conjunctiva/sclera: Conjunctivae normal.     Pupils: Pupils are equal, round, and reactive to light.  Cardiovascular:     Rate and Rhythm: Regular rhythm. Tachycardia present.      Heart sounds: Normal heart sounds.  Pulmonary:     Effort: Pulmonary effort is normal. Tachypnea present.     Breath sounds: Normal breath sounds.  Abdominal:     Palpations: Abdomen is soft.     Tenderness: There is no abdominal tenderness.  Musculoskeletal:     Cervical back: Neck supple. No rigidity.     Right lower leg: No edema.     Left lower leg: No edema.  Skin:    General: Skin is warm.  Neurological:     Comments: 2+ patellar DTR bilaterally, no clonus. Moves all extremities spontaneously.     ED Results / Procedures / Treatments   Labs (all labs ordered are listed, but only abnormal results are displayed) Labs Reviewed  CBC WITH DIFFERENTIAL/PLATELET - Abnormal; Notable for the following components:      Result Value   WBC 20.6 (*)    Neutro Abs 16.3 (*)    Monocytes Absolute 2.1 (*)    Abs Immature Granulocytes 0.14 (*)    All other components within normal limits  COMPREHENSIVE METABOLIC PANEL - Abnormal; Notable for the following components:   CO2 20 (*)    Glucose, Bld 137 (*)    BUN 23 (*)    AST 169 (*)    ALT 65 (*)    All other components within normal limits  SALICYLATE LEVEL - Abnormal; Notable for the following components:   Salicylate Lvl <7.0 (*)    All other components within normal limits  ACETAMINOPHEN LEVEL - Abnormal; Notable for the following components:   Acetaminophen (Tylenol), Serum <10 (*)    All other components within normal limits  I-STAT VENOUS BLOOD GAS, ED - Abnormal; Notable for the following components:   pH, Ven 7.446 (*)    pCO2, Ven 34.1 (*)    All other components within normal limits  RESPIRATORY PANEL BY RT PCR (FLU A&B, COVID)  CULTURE, BLOOD (ROUTINE X 2)  CULTURE, BLOOD (ROUTINE X 2)  AMMONIA  TSH  ETHANOL  MAGNESIUM  RAPID URINE DRUG SCREEN, HOSP PERFORMED  URINALYSIS, ROUTINE W REFLEX MICROSCOPIC    EKG None  Radiology CT Head Wo Contrast  Result Date: 07/16/2020 CLINICAL DATA:  Altered mental status.  EXAM: CT HEAD WITHOUT CONTRAST TECHNIQUE: Contiguous axial images were obtained from the base of the skull through the vertex without intravenous contrast. COMPARISON:  None. FINDINGS: Brain: No evidence of acute infarction, hemorrhage, hydrocephalus, extra-axial collection or mass lesion/mass effect. Vascular: No hyperdense vessel or unexpected calcification. Skull: Normal. Negative for fracture or focal lesion. Sinuses/Orbits: There is mild to moderate severity right maxillary sinus mucosal thickening.  Other: None. IMPRESSION: 1. No acute intracranial abnormality. 2. Mild to moderate severity right maxillary sinus disease. Electronically Signed   By: Aram Candela M.D.   On: 07/16/2020 22:34   DG Chest Portable 1 View  Result Date: 07/16/2020 CLINICAL DATA:  Altered mental status for over 24 hours, last known well last night EXAM: PORTABLE CHEST 1 VIEW COMPARISON:  Radiograph 08/31/2017 FINDINGS: Low lung volumes. Diffuse hazy interstitial opacities with markedly cephalized indistinct pulmonary vascularity. Prominence of the cardiac silhouette is noted though possibly accentuated by low volumes and portable technique. No pneumothorax or visible effusion. No acute osseous or soft tissue abnormality. Degenerative changes are present in the imaged spine and shoulders. Telemetry leads overlie the chest. IMPRESSION: Low volumes and atelectatic change. Additional features consistent with mild CHF/volume overload with pulmonary congestion, possible cardiomegaly, and hazy interstitial opacities suggestive of interstitial edema. Electronically Signed   By: Kreg Shropshire M.D.   On: 07/16/2020 22:29    Procedures Procedures (including critical care time)  Medications Ordered in ED Medications  piperacillin-tazobactam (ZOSYN) IVPB 3.375 g (has no administration in time range)  vancomycin (VANCOREADY) IVPB 2000 mg/400 mL (has no administration in time range)    ED Course  I have reviewed the triage vital  signs and the nursing notes.  Pertinent labs & imaging results that were available during my care of the patient were reviewed by me and considered in my medical decision making (see chart for details).    MDM Rules/Calculators/A&P                          WBC 20.5 with left shift.  Mild transaminitis, consistent with alcohol use yesterday.  Coingestion labs unremarkable.  TSH, ammonia within normal limits.  Patient is not acidotic or hypercarbic on VBG.  Chest x-ray showed low lung volumes and possible interstitial edema.  Spoke with poison control, no additional testing recommended. Will continue to monitor.   Patient denies headache and neck pain when he wakes up. No fever or neck tenderness. Do not suspect meningitis.   Suspect patient unintentionally overdosed on Flexeril and EtOH and aspirated while somnolent.  CT head unremarkable, but will obtain MRI to rule out any intracranial cause of altered undersurface. Patient is mildly tachypneic, tachycardic, and has a new oxygen requirement of 2 L. Treating for aspiration pneumonia with Zosyn and vancomycin.   Patient admitted for further management.  This patient was seen with Dr. Rush Landmark.  Final Clinical Impression(s) / ED Diagnoses Final diagnoses:  Altered mental status, unspecified altered mental status type    Rx / DC Orders ED Discharge Orders    None       Allayne Butcher, MD 07/16/20 2343    Tegeler, Canary Brim, MD 07/17/20 1046

## 2020-07-17 ENCOUNTER — Observation Stay (HOSPITAL_COMMUNITY): Payer: BC Managed Care – PPO

## 2020-07-17 ENCOUNTER — Emergency Department (HOSPITAL_COMMUNITY): Payer: BC Managed Care – PPO

## 2020-07-17 ENCOUNTER — Other Ambulatory Visit: Payer: Self-pay

## 2020-07-17 DIAGNOSIS — T50901A Poisoning by unspecified drugs, medicaments and biological substances, accidental (unintentional), initial encounter: Secondary | ICD-10-CM | POA: Diagnosis present

## 2020-07-17 DIAGNOSIS — IMO0002 Reserved for concepts with insufficient information to code with codable children: Secondary | ICD-10-CM | POA: Diagnosis present

## 2020-07-17 DIAGNOSIS — F10959 Alcohol use, unspecified with alcohol-induced psychotic disorder, unspecified: Secondary | ICD-10-CM | POA: Diagnosis not present

## 2020-07-17 LAB — COMPREHENSIVE METABOLIC PANEL
ALT: 66 U/L — ABNORMAL HIGH (ref 0–44)
AST: 160 U/L — ABNORMAL HIGH (ref 15–41)
Albumin: 3.7 g/dL (ref 3.5–5.0)
Alkaline Phosphatase: 73 U/L (ref 38–126)
Anion gap: 13 (ref 5–15)
BUN: 22 mg/dL — ABNORMAL HIGH (ref 6–20)
CO2: 22 mmol/L (ref 22–32)
Calcium: 9.4 mg/dL (ref 8.9–10.3)
Chloride: 101 mmol/L (ref 98–111)
Creatinine, Ser: 1.03 mg/dL (ref 0.61–1.24)
GFR calc Af Amer: >60 mL/min (ref >60)
GFR calc non Af Amer: >60 mL/min (ref >60)
Glucose, Bld: 140 mg/dL — ABNORMAL HIGH (ref 70–99)
Potassium: 3.6 mmol/L (ref 3.5–5.1)
Sodium: 136 mmol/L (ref 135–145)
Total Bilirubin: 1.5 mg/dL — ABNORMAL HIGH (ref 0.3–1.2)
Total Protein: 6.9 g/dL (ref 6.5–8.1)

## 2020-07-17 LAB — CBC
HCT: 44.6 % (ref 39.0–52.0)
Hemoglobin: 14.5 g/dL (ref 13.0–17.0)
MCH: 28.8 pg (ref 26.0–34.0)
MCHC: 32.5 g/dL (ref 30.0–36.0)
MCV: 88.5 fL (ref 80.0–100.0)
Platelets: 242 10*3/uL (ref 150–400)
RBC: 5.04 MIL/uL (ref 4.22–5.81)
RDW: 14 % (ref 11.5–15.5)
WBC: 17.7 10*3/uL — ABNORMAL HIGH (ref 4.0–10.5)
nRBC: 0 % (ref 0.0–0.2)

## 2020-07-17 LAB — CK
Total CK: 8530 U/L — ABNORMAL HIGH (ref 49–397)
Total CK: 5805 U/L — ABNORMAL HIGH (ref 49–397)

## 2020-07-17 LAB — URINALYSIS, ROUTINE W REFLEX MICROSCOPIC
Bacteria, UA: NONE SEEN
Bilirubin Urine: NEGATIVE
Glucose, UA: NEGATIVE mg/dL
Ketones, ur: NEGATIVE mg/dL
Leukocytes,Ua: NEGATIVE
Nitrite: NEGATIVE
Protein, ur: 30 mg/dL — AB
Specific Gravity, Urine: 1.021 (ref 1.005–1.030)
pH: 5 (ref 5.0–8.0)

## 2020-07-17 LAB — PROTIME-INR
INR: 1.1 (ref 0.8–1.2)
Prothrombin Time: 13.6 seconds (ref 11.4–15.2)

## 2020-07-17 LAB — RAPID URINE DRUG SCREEN, HOSP PERFORMED
Amphetamines: NOT DETECTED
Barbiturates: NOT DETECTED
Benzodiazepines: NOT DETECTED
Cocaine: NOT DETECTED
Opiates: NOT DETECTED
Tetrahydrocannabinol: POSITIVE — AB

## 2020-07-17 LAB — HEMOGLOBIN A1C
Hgb A1c MFr Bld: 5.6 % (ref 4.8–5.6)
Mean Plasma Glucose: 114.02 mg/dL

## 2020-07-17 LAB — HIV ANTIBODY (ROUTINE TESTING W REFLEX): HIV Screen 4th Generation wRfx: NONREACTIVE

## 2020-07-17 MED ORDER — THIAMINE HCL 100 MG PO TABS
100.0000 mg | ORAL_TABLET | Freq: Every day | ORAL | Status: DC
  Administered 2020-07-17 – 2020-07-19 (×3): 100 mg via ORAL
  Filled 2020-07-17 (×4): qty 1

## 2020-07-17 MED ORDER — FOLIC ACID 1 MG PO TABS
1.0000 mg | ORAL_TABLET | Freq: Every day | ORAL | Status: DC
  Administered 2020-07-17 – 2020-07-19 (×3): 1 mg via ORAL
  Filled 2020-07-17 (×3): qty 1

## 2020-07-17 MED ORDER — ENOXAPARIN SODIUM 80 MG/0.8ML ~~LOC~~ SOLN
70.0000 mg | SUBCUTANEOUS | Status: DC
  Administered 2020-07-17 – 2020-07-18 (×2): 70 mg via SUBCUTANEOUS
  Filled 2020-07-17: qty 0.7
  Filled 2020-07-17 (×2): qty 0.8
  Filled 2020-07-17: qty 0.7

## 2020-07-17 MED ORDER — SODIUM CHLORIDE 0.9 % IV SOLN: INTRAVENOUS | Status: DC

## 2020-07-17 MED ORDER — INFLUENZA VAC SPLIT QUAD 0.5 ML IM SUSY
0.5000 mL | PREFILLED_SYRINGE | INTRAMUSCULAR | Status: AC
  Administered 2020-07-18: 0.5 mL via INTRAMUSCULAR
  Filled 2020-07-17: qty 0.5

## 2020-07-17 NOTE — Progress Notes (Signed)
Attempted to get pt for MRI exam, pt very fidgety and combative per transport. Will attempt at a later time. Pt screened and cleared for MRI by wife.

## 2020-07-17 NOTE — ED Notes (Signed)
A&Ox3;

## 2020-07-17 NOTE — CV Procedure (Signed)
2D echo attempted, pateint  In ED hallway and having MD consult. Will try echo later

## 2020-07-17 NOTE — Hospital Course (Addendum)
Howard Miller is a 49 y.o. male the presented with AMS in the setting of possible cyclobenzaprine overdose.  PMH significant for chronic back pain hypertension.  Cyclobenzaprine toxicity causing encephalopathy, improving Patient was admitted with AMS in the reported setting of taking an unknown amount of Flexeril, and binge drinking alcohol.  Poison control was called for recommendations and during his hospitalization, he received EKGs every 12 hours initially which showed no widening of the QRS.  Patient initially hyperreflexic and bilateral upper and lower extremities.  There was concern for serotonin syndrome, patient was hyperreflexic and confused, but without rigidity, hyperthermia, or clonus.  Patient was aggressively fluid resuscitated with improvement. Patient continued to have hyperreflexia in patellar reflexes, baseline reflexes unknown and recommended evaluation after discharge.   Rhabdomyolysis Patient was alone while altered for several hours, CK levels were obtained and were noted to be elevated at over 8000 with accompanied elevated AST/ALT.  Patient was aggressively fluid resuscitated with improvement in downtrending labs.  Rhabdomyolysis was likely secondary to cyclobenzaprine use combined with being down for several hours.   Urinary retention Patient found to have urinary retention on bladder scan.  Likely related to the anticholinergic properties of cyclobenzaprine in the setting of overdose.  Foley was placed, patient had a voiding trial ***   Cough During his hospital stay, patient was found to have new, deep inspiration.  Admission there was concern for aspirational pneumonia, given that the patient was found down and wife reported an episode of emesis while he was laying down.  WBC was 20 on arrival, in the ED patient was given 1 dose of vancomycin and Zosyn.  CXR on admission showed pulmonary findings consistent with volume overload/CHF. Echo showed EF 60-65%, grade I diastolic  dysfunction, moderate left ventricular hypertrophy, aortic dilation. Due to aspirational pneumonia concern, patient was placed on Augmentin 875-125 for a total of 5 days.    Elevated WBC Patient admitted with elevated WBC at 20, s/p 1 dose of vanc and zosyn in the ED due to initial concern for aspiration pneumonia. No abx continued with admission, as patient was afebrile, asymptomatic, and was likely that the increased white blood cell count due to demargination in the setting of the acute stressor of overdose.    HTN, stable Patient is a history of hypertension, not on any medications. Patient had several elevated pressures during admission. Recommended outpatient follow-up.   Hyperglycemia, stable On admission patient's blood glucose was 136.  HbA1c was obtained at 5.6, not on any medications.  Glucose was monitored, no intervention was necessary.  Needs outpatient follow-up.   Chronic back pain Patient chronically takes Tylenol, was taking occasional flexiril.  Due to downtrending LFTs and CK, patient was given low-dose Tylenol for pain relief.   F/U Recommend follow-up for MR finding of FLAIR hyperintensities. If patient has not returned to neurologic baseline, recommend continued outpatient work up. Recommend PCP blood pressure follow-up. Patient had several elevated pressures during admission, evaluate if initiation of medication necessary.  Recommend PCP follow-up for right upper extremity rash, consistent with contact dermatitis. ***If repeat voiding trial shows bladder scan >3107mL, will need foley with outpatient urology follow-up.

## 2020-07-17 NOTE — Progress Notes (Signed)
Was called by nurse about bladder scan of 740. An in and out cath was performed obtaining 1250 cc. Discussed with nurse additional scan in 4 hours, if greater than 300 cc perform in and out cath a second time. Then 4 hours from that bladder scan again, if at that point greater than 300 cc place a foley. Order was placed after discussion with nurse.

## 2020-07-17 NOTE — Progress Notes (Addendum)
.  FPTS Interim Progress Note  S: Patient awake and was speaking, when attempting to ask questions about recent events, patient went off in different tangents about unrelated topics including at some point mentioning a party, although wife is unsure what he means.  Patient is not sure of how he arrived to the hospital or why he is here.  Patient unsure of how many Flexeril he took, reports that he was drinking alcohol on Sunday.  Patient's recent history is difficult to ascertain, as patient is unsure of what is happening recently although he does state he was drinking and did take Flexeril. Wife states that the patient had a prescription for 90 Flexeril tablets that he received 2 years ago, and has been taking it intermittently as he is needed.  She reports that there was not very much in the bottle, only enough pills to cover the bottom of the bottle.  Wife states that the patient previously had a large drinking problem, but states that now he only drinks once a month, but when he does drink he can drink 12-15 12 ounce cans of beer.  O: BP (!) 139/93 (BP Location: Left Arm)   Pulse 89   Temp 98.1 F (36.7 C) (Oral)   Resp (!) 21   Ht 6\' 2"  (1.88 m)   Wt 136.1 kg   SpO2 92%   BMI 38.52 kg/m   General: Awake, wife by bedside Neuro: Cranial nerves II through XII intact, hyperreflexia in lower extremities, no clonus appreciated on this exam, no rigidity, easily moves all limbs. A&O x3, some slurred speech noted. Concern for confabulation during exam.   A/P: Howard Miller is a 49 y.o. male with a PMH of obesity and chronic back pain who presented with AMS in the setting of alcohol and cyclobenzaprine use. The patient appeared to have confabulation and with the history of alcohol abuse and binge drinking, there is concern for possible Wernicke's encephalopathy. Refer to H&P for plan, changes to plan are as follows: --Increased IVF to 266mL/hr --Starting patient on Thiamine --Continue to titrate  fluids for rhabdomyolysis with monitoring UOP and CK levels.    45m, DO 07/17/2020, 7:34 AM PGY-1, [facility] Family Medicine Service pager [phone]

## 2020-07-17 NOTE — Progress Notes (Signed)
Most recent EKG with QRS <149ms at 51ms. No indication for sodium bicarb at this time. Will continue to monitor.  Shirlean Mylar, MD [facility], PGY-2

## 2020-07-17 NOTE — ED Notes (Signed)
Back from MRI.

## 2020-07-17 NOTE — ED Notes (Signed)
At MRI 

## 2020-07-17 NOTE — Progress Notes (Addendum)
Noted order for bladder scan which showed . In/out bladder cath completed with of urine. Paged and notified on call provider for Family Medicine. Awaiting return call with any new orders.

## 2020-07-18 ENCOUNTER — Observation Stay (HOSPITAL_COMMUNITY): Payer: BC Managed Care – PPO

## 2020-07-18 DIAGNOSIS — F10959 Alcohol use, unspecified with alcohol-induced psychotic disorder, unspecified: Secondary | ICD-10-CM | POA: Diagnosis not present

## 2020-07-18 DIAGNOSIS — R7401 Elevation of levels of liver transaminase levels: Secondary | ICD-10-CM | POA: Diagnosis present

## 2020-07-18 DIAGNOSIS — I5023 Acute on chronic systolic (congestive) heart failure: Secondary | ICD-10-CM

## 2020-07-18 DIAGNOSIS — Z23 Encounter for immunization: Secondary | ICD-10-CM | POA: Diagnosis not present

## 2020-07-18 DIAGNOSIS — R4182 Altered mental status, unspecified: Secondary | ICD-10-CM | POA: Diagnosis present

## 2020-07-18 DIAGNOSIS — Z6838 Body mass index (BMI) 38.0-38.9, adult: Secondary | ICD-10-CM | POA: Diagnosis not present

## 2020-07-18 DIAGNOSIS — T481X1A Poisoning by skeletal muscle relaxants [neuromuscular blocking agents], accidental (unintentional), initial encounter: Secondary | ICD-10-CM | POA: Diagnosis present

## 2020-07-18 DIAGNOSIS — I10 Essential (primary) hypertension: Secondary | ICD-10-CM | POA: Diagnosis present

## 2020-07-18 DIAGNOSIS — J810 Acute pulmonary edema: Secondary | ICD-10-CM | POA: Diagnosis present

## 2020-07-18 DIAGNOSIS — G8929 Other chronic pain: Secondary | ICD-10-CM | POA: Diagnosis present

## 2020-07-18 DIAGNOSIS — R339 Retention of urine, unspecified: Secondary | ICD-10-CM | POA: Diagnosis not present

## 2020-07-18 DIAGNOSIS — M5136 Other intervertebral disc degeneration, lumbar region: Secondary | ICD-10-CM | POA: Diagnosis present

## 2020-07-18 DIAGNOSIS — G92 Toxic encephalopathy: Secondary | ICD-10-CM | POA: Diagnosis present

## 2020-07-18 DIAGNOSIS — M6282 Rhabdomyolysis: Secondary | ICD-10-CM | POA: Diagnosis present

## 2020-07-18 DIAGNOSIS — Z20822 Contact with and (suspected) exposure to covid-19: Secondary | ICD-10-CM | POA: Diagnosis present

## 2020-07-18 DIAGNOSIS — R739 Hyperglycemia, unspecified: Secondary | ICD-10-CM | POA: Diagnosis present

## 2020-07-18 DIAGNOSIS — E669 Obesity, unspecified: Secondary | ICD-10-CM | POA: Diagnosis present

## 2020-07-18 DIAGNOSIS — T50901A Poisoning by unspecified drugs, medicaments and biological substances, accidental (unintentional), initial encounter: Secondary | ICD-10-CM | POA: Diagnosis not present

## 2020-07-18 LAB — CBC
HCT: 38.1 % — ABNORMAL LOW (ref 39.0–52.0)
Hemoglobin: 12 g/dL — ABNORMAL LOW (ref 13.0–17.0)
MCH: 28.8 pg (ref 26.0–34.0)
MCHC: 31.5 g/dL (ref 30.0–36.0)
MCV: 91.4 fL (ref 80.0–100.0)
Platelets: 214 10*3/uL (ref 150–400)
RBC: 4.17 MIL/uL — ABNORMAL LOW (ref 4.22–5.81)
RDW: 14.1 % (ref 11.5–15.5)
WBC: 8.1 10*3/uL (ref 4.0–10.5)
nRBC: 0 % (ref 0.0–0.2)

## 2020-07-18 LAB — BLOOD CULTURE ID PANEL (REFLEXED) - BCID2

## 2020-07-18 LAB — COMPREHENSIVE METABOLIC PANEL
ALT: 53 U/L — ABNORMAL HIGH (ref 0–44)
AST: 88 U/L — ABNORMAL HIGH (ref 15–41)
Albumin: 3 g/dL — ABNORMAL LOW (ref 3.5–5.0)
Alkaline Phosphatase: 57 U/L (ref 38–126)
Anion gap: 9 (ref 5–15)
BUN: 16 mg/dL (ref 6–20)
CO2: 25 mmol/L (ref 22–32)
Calcium: 8.1 mg/dL — ABNORMAL LOW (ref 8.9–10.3)
Chloride: 104 mmol/L (ref 98–111)
Creatinine, Ser: 0.85 mg/dL (ref 0.61–1.24)
GFR calc Af Amer: >60 mL/min (ref >60)
GFR calc non Af Amer: >60 mL/min (ref >60)
Glucose, Bld: 95 mg/dL (ref 70–99)
Potassium: 3.6 mmol/L (ref 3.5–5.1)
Sodium: 138 mmol/L (ref 135–145)
Total Bilirubin: 1 mg/dL (ref 0.3–1.2)
Total Protein: 5.7 g/dL — ABNORMAL LOW (ref 6.5–8.1)

## 2020-07-18 LAB — CULTURE, BLOOD (ROUTINE X 2): Special Requests: ADEQUATE

## 2020-07-18 LAB — ECHOCARDIOGRAM COMPLETE
Area-P 1/2: 3.46 cm2
Calc EF: 67.1 %
Height: 74 in
S' Lateral: 2.6 cm
Single Plane A2C EF: 72.9 %
Single Plane A4C EF: 60.2 %
Weight: 5024 oz

## 2020-07-18 LAB — CK: Total CK: 2969 U/L — ABNORMAL HIGH (ref 49–397)

## 2020-07-18 MED ORDER — CHLORHEXIDINE GLUCONATE CLOTH 2 % EX PADS
6.0000 | MEDICATED_PAD | Freq: Every day | CUTANEOUS | Status: DC
  Administered 2020-07-18: 6 via TOPICAL

## 2020-07-18 MED ORDER — ENOXAPARIN SODIUM 80 MG/0.8ML ~~LOC~~ SOLN
70.0000 mg | SUBCUTANEOUS | Status: DC
  Administered 2020-07-19: 70 mg via SUBCUTANEOUS
  Filled 2020-07-18: qty 0.8

## 2020-07-18 MED ORDER — ACETAMINOPHEN 325 MG PO TABS
325.0000 mg | ORAL_TABLET | Freq: Four times a day (QID) | ORAL | Status: DC | PRN
  Administered 2020-07-18 – 2020-07-19 (×3): 325 mg via ORAL
  Filled 2020-07-18 (×3): qty 1

## 2020-07-18 MED ORDER — PERFLUTREN LIPID MICROSPHERE
1.0000 mL | INTRAVENOUS | Status: AC | PRN
  Administered 2020-07-18: 2 mL via INTRAVENOUS
  Filled 2020-07-18: qty 10

## 2020-07-18 NOTE — Progress Notes (Signed)
FPTS Interim Progress Note  S:  Patient awake, alert and sitting on side of bed.  Has no complaints at this time other than back pain.  Patient indicates he is currently in hospital due to taking too many of his Flexeril.  Indicates he forgot he took earlier and then continued to take after returning home intoxicated from party.  Indicates he normally takes 1 but that night he had 10.  Denies any SI.  When asked if he took so many pills due to depression responded "I get mad like everyone does sometimes."  Responds appropriately to some questions and inappropriately to others.  Patient does indicate he is still feeling groggy.   Per nurse patient still has not urinated.  O: BP 131/72 (BP Location: Left Arm)   Pulse 93   Temp 97.9 F (36.6 C) (Oral)   Resp 20   Ht 6\' 2"  (1.88 m)   Wt (!) 141.2 kg   SpO2 96%   BMI 39.96 kg/m    Physical Exam Constitutional:      General: He is not in acute distress.    Appearance: Normal appearance. He is not ill-appearing.  HENT:     Mouth/Throat:     Mouth: Mucous membranes are moist.  Cardiovascular:     Rate and Rhythm: Normal rate and regular rhythm.  Pulmonary:     Effort: Pulmonary effort is normal.     Breath sounds: Normal breath sounds.  Abdominal:     General: Abdomen is flat.     Palpations: Abdomen is soft.  Musculoskeletal:        General: Normal range of motion.  Skin:    General: Skin is warm.  Neurological:     General: No focal deficit present.     Mental Status: He is alert. He is confused.     GCS: GCS eye subscore is 4. GCS verbal subscore is 4. GCS motor subscore is 6.     Gait: Gait normal.     Comments: Patient oriented to person, aware the year is 2021, President is 2022, but thought month was February, was aware in hospital and identified hospital as [facility] after taking sometime to think about it     A/P: Continue to monitor patient's Vital signs overnight.  Monitor urine output, has plan in place if retains  urine.  Re-check mental status in AM.  March, MD 07/18/2020, 12:48 AM PGY-1, [facility] Family Medicine Service pager [phone]

## 2020-07-18 NOTE — Progress Notes (Signed)
PHARMACY - PHYSICIAN COMMUNICATION CRITICAL VALUE ALERT - BLOOD CULTURE IDENTIFICATION (BCID)  Howard Miller is an 49 y.o. male who presented to [facility] on 07/16/2020 with a chief complaint of altered mental status  Assessment:  Afebrile, no obvious infection  Name of physician (or Provider) Contacted: Dr. Pecola Leisure (FMTS)  Current antibiotics: None  Changes to prescribed antibiotics recommended:  None  Results for orders placed or performed during the hospital encounter of 07/16/20  Blood Culture ID Panel (Reflexed) (Collected: 07/16/2020 10:55 PM)  Result Value Ref Range   Enterococcus faecalis NOT DETECTED NOT DETECTED   Enterococcus Faecium NOT DETECTED NOT DETECTED   Listeria monocytogenes NOT DETECTED NOT DETECTED   Staphylococcus species DETECTED (A) NOT DETECTED   Staphylococcus aureus (BCID) NOT DETECTED NOT DETECTED   Staphylococcus epidermidis DETECTED (A) NOT DETECTED   Staphylococcus lugdunensis NOT DETECTED NOT DETECTED   Streptococcus species NOT DETECTED NOT DETECTED   Streptococcus agalactiae NOT DETECTED NOT DETECTED   Streptococcus pneumoniae NOT DETECTED NOT DETECTED   Streptococcus pyogenes NOT DETECTED NOT DETECTED   A.calcoaceticus-baumannii NOT DETECTED NOT DETECTED   Bacteroides fragilis NOT DETECTED NOT DETECTED   Enterobacterales NOT DETECTED NOT DETECTED   Enterobacter cloacae complex NOT DETECTED NOT DETECTED   Escherichia coli NOT DETECTED NOT DETECTED   Klebsiella aerogenes NOT DETECTED NOT DETECTED   Klebsiella oxytoca NOT DETECTED NOT DETECTED   Klebsiella pneumoniae NOT DETECTED NOT DETECTED   Proteus species NOT DETECTED NOT DETECTED   Salmonella species NOT DETECTED NOT DETECTED   Serratia marcescens NOT DETECTED NOT DETECTED   Haemophilus influenzae NOT DETECTED NOT DETECTED   Neisseria meningitidis NOT DETECTED NOT DETECTED   Pseudomonas aeruginosa NOT DETECTED NOT DETECTED   Stenotrophomonas maltophilia NOT DETECTED NOT DETECTED    Candida albicans NOT DETECTED NOT DETECTED   Candida auris NOT DETECTED NOT DETECTED   Candida glabrata NOT DETECTED NOT DETECTED   Candida krusei NOT DETECTED NOT DETECTED   Candida parapsilosis NOT DETECTED NOT DETECTED   Candida tropicalis NOT DETECTED NOT DETECTED   Cryptococcus neoformans/gattii NOT DETECTED NOT DETECTED   Methicillin resistance mecA/C NOT DETECTED NOT DETECTED    Abran Duke 07/18/2020  5:13 AM

## 2020-07-18 NOTE — Progress Notes (Signed)
  Echocardiogram 2D Echocardiogram with definity has been performed.  Leta Jungling M 07/18/2020, 10:23 AM

## 2020-07-18 NOTE — Progress Notes (Signed)
Family Medicine Teaching Service Daily Progress Note Intern Pager: [phone]  Patient name: Howard Miller Medical record number: [id number] Date of birth: Nov 18, 1970 Age: 49 y.o. Gender: male  Primary Care Provider: Farris Has, MD Consultants: None Code Status: Full  Pt Overview and Major Events to Date:  9/29: Admitted for possible cyclobenzaprine OD  Assessment and Plan: Howard Miller is a 49 y.o. male who presented with AMS. PMH significant for chronic pain with degenerative disc disease.  Cyclobenzaprine toxicity causing encephalopathy, improving Patient states he took 10 doses of Flexeril after being intoxicated at a party, states OD was unintentional and Miller no SI. Brisk reflexes in b/l LE, patient A&O x4, strength 5+ in b/l UE and LE.  UDS positive for THC, UA shows large Hgb.  ECG this morning shows normal QRS. - Continuous cardiac monitoring - Continuous pulse ox - Per poison control, if QRS >120 given sodium 1-34mEq/kg as a push then repeat EKG to see if QRS narrowing. Continue to f/u with poison control. - Monitor temperature for hyperthermia, cooling measures if necessary - Monitor UOP, strict I's/O's - Decreasing fluids to 184mL/hr  Rhabdomyolysis Likely secondary to cyclobenzaprine as well as possibly having been down for several hours before admission.  CK trend 8530> 5805> 2969. UA shows large Hgb.  BUN 16, Cr 0.85, AST/ALT 88/53 - Continue mIVF at 274mL/hr, monitor for fluid overload  Urinary retention Likely related to anticholinergic properties of cyclobenzaprine relation to overdose. Overnight bladder scan showed 740 cc and in and out was performed with 1250 cc obtained.  Subsequent bladder scans showed volumes > 300, patient failed void attempts. -Foley to be placed -Continue to monitor  Cough Patient Miller new cough with deep inspiration.  Patient reports that this is new.  Wife did note that on Monday patient had vomited and was sleeping.  On admission there was  concern for aspirational pneumonia, given AMS and WBC of 20; patient was given Vanco and Zosyn x1.  CXR on admission showed pulmonary findings consistent with volume overload/CHF so antibiotics were not continued.  Could also be due to volume overload, patient is being given aggressive fluids for rhabdomyolysis. -CBC today -Follow-up echo  Elevated WBC Patient admitted with elevated WBC at 20, s/p 1 dose of vanc and zosyn in the ED due to initial concern for aspiration pneumonia. No abx continued with admission, WBC likely elevated due to neutrophilic demargination from acute stressor of overdose.   - Continue to monitor for signs of pneumonia -CBC today  HTN, stable Patient Miller a history of HTN, not on any medications.  Had elevated pressure to 122/98 overnight, currently normotensive. - Continue to monitor BP  Pre-diabetes, stable HbA1c 5.6.  No medications currently.  Blood glucose admission was 136, currently 95. -Continue to monitor  Chronic back pain Patient chronically takes Tylenol, was taking occasional flexiril. Patient complains of low back pain, CK and LFTs are downtrending. - Tylenol 325mg  PRN  FEN/GI: IVF NS @250  mL/h PPx: Lovenox   Status is: Med-surg  The patient will require care spanning > 2 midnights and should be moved to inpatient because: Inpatient level of care appropriate due to severity of illness  Dispo:  Patient From: Home  Planned Disposition: Home  Expected discharge date: 07/21/20  Medically stable for discharge: No    Subjective:  Patient reports that he feels he is doing better today.  He states that he does not necessarily feel confused, but is aware that other people sometimes do not know  what he is saying.  Objective: Temp:  [97.6 F (36.4 C)-98.5 F (36.9 C)] 98.1 F (36.7 C) (09/29 0755) Pulse Rate:  [88-120] 88 (09/29 0755) Resp:  [18-20] 19 (09/29 0755) BP: (107-152)/(72-100) 128/87 (09/29 0755) SpO2:  [91 %-97 %] 91 % (09/29  0755) Weight:  [141.2 kg-142.4 kg] 142.4 kg (09/29 0436) Physical Exam: General: Laying in bed, NAD Cardiovascular: RRR, no M/R/G appreciated, no peripheral edema Respiratory: Normal WOB, no wheezes/rales/rhonchi appreciated, cough present on exam with deep inhalation Abdomen: Soft, non-tender, non-distended, obese Neuro: CN II through XII intact, brisk reflexes in b/l LE, A&O x4, answering questions appropriately  Laboratory: Recent Labs  Lab 07/16/20 2125 07/16/20 2236 07/17/20 0327  WBC 20.6*  --  17.7*  HGB 15.0 15.3 14.5  HCT 46.7 45.0 44.6  PLT 266  --  242   Recent Labs  Lab 07/16/20 2125 07/16/20 2125 07/16/20 2236 07/17/20 0327 07/18/20 0658  NA 137   < > 138 136 138  K 4.6   < > 4.0 3.6 3.6  CL 103  --   --  101 104  CO2 20*  --   --  22 25  BUN 23*  --   --  22* 16  CREATININE 1.07  --   --  1.03 0.85  CALCIUM 9.9  --   --  9.4 8.1*  PROT 7.3  --   --  6.9 5.7*  BILITOT 1.1  --   --  1.5* 1.0  ALKPHOS 76  --   --  73 57  ALT 65*  --   --  66* 53*  AST 169*  --   --  160* 88*  GLUCOSE 137*  --   --  140* 95   < > = values in this interval not displayed.      Imaging/Diagnostic Tests: MR BRAIN WO CONTRAST  Result Date: 07/17/2020 CLINICAL DATA:  Mental status change EXAM: MRI HEAD WITHOUT CONTRAST TECHNIQUE: Multiplanar, multiecho pulse sequences of the brain and surrounding structures were obtained without intravenous contrast. COMPARISON:  07/16/2020 head CT and prior. FINDINGS: Please note motion artifact limits evaluation. Brain: No diffusion-weighted signal abnormality. No intracranial hemorrhage. Few scattered T2/FLAIR hyperintense foci involving the right frontal subcortical white matter. No midline shift, ventriculomegaly or extra-axial fluid collection. No mass lesion. Vascular: Normal flow voids. Skull and upper cervical spine: Normal marrow signal. Sinuses/Orbits: Normal orbits. Ethmoid and right maxillary sinus mucosal thickening. No mastoid  effusion. Other: None. IMPRESSION: No acute intracranial process. Few nonspecific right frontal white matter FLAIR hyperintensities. Differential includes chronic microvascular ischemic changes, post infectious/inflammatory sequela, migraines and demyelination. Electronically Signed   By: Stana Bunting M.D.   On: 07/17/2020 12:44     Evelena Leyden, DO 07/18/2020, 12:00 PM PGY-1, [facility] Family Medicine FPTS Intern pager: [phone], text pages welcome

## 2020-07-19 DIAGNOSIS — R339 Retention of urine, unspecified: Secondary | ICD-10-CM

## 2020-07-19 LAB — COMPREHENSIVE METABOLIC PANEL
ALT: 50 U/L — ABNORMAL HIGH (ref 0–44)
AST: 68 U/L — ABNORMAL HIGH (ref 15–41)
Albumin: 3 g/dL — ABNORMAL LOW (ref 3.5–5.0)
Alkaline Phosphatase: 55 U/L (ref 38–126)
Anion gap: 9 (ref 5–15)
BUN: 10 mg/dL (ref 6–20)
CO2: 25 mmol/L (ref 22–32)
Calcium: 8.4 mg/dL — ABNORMAL LOW (ref 8.9–10.3)
Chloride: 106 mmol/L (ref 98–111)
Creatinine, Ser: 0.9 mg/dL (ref 0.61–1.24)
GFR calc Af Amer: >60 mL/min (ref >60)
GFR calc non Af Amer: >60 mL/min (ref >60)
Glucose, Bld: 91 mg/dL (ref 70–99)
Potassium: 3.9 mmol/L (ref 3.5–5.1)
Sodium: 140 mmol/L (ref 135–145)
Total Bilirubin: 1 mg/dL (ref 0.3–1.2)
Total Protein: 5.9 g/dL — ABNORMAL LOW (ref 6.5–8.1)

## 2020-07-19 LAB — CBC
HCT: 37.6 % — ABNORMAL LOW (ref 39.0–52.0)
Hemoglobin: 12 g/dL — ABNORMAL LOW (ref 13.0–17.0)
MCH: 28.9 pg (ref 26.0–34.0)
MCHC: 31.9 g/dL (ref 30.0–36.0)
MCV: 90.6 fL (ref 80.0–100.0)
Platelets: 217 10*3/uL (ref 150–400)
RBC: 4.15 MIL/uL — ABNORMAL LOW (ref 4.22–5.81)
RDW: 13.8 % (ref 11.5–15.5)
WBC: 6.5 10*3/uL (ref 4.0–10.5)
nRBC: 0 % (ref 0.0–0.2)

## 2020-07-19 LAB — CK: Total CK: 2101 U/L — ABNORMAL HIGH (ref 49–397)

## 2020-07-19 MED ORDER — FOLIC ACID 1 MG PO TABS: 1.0000 mg | ORAL_TABLET | Freq: Every day | ORAL | Status: DC

## 2020-07-19 MED ORDER — AMOXICILLIN-POT CLAVULANATE 875-125 MG PO TABS
1.0000 | ORAL_TABLET | Freq: Two times a day (BID) | ORAL | Status: DC
  Administered 2020-07-19 (×2): 1 via ORAL
  Filled 2020-07-19 (×2): qty 1

## 2020-07-19 MED ORDER — THIAMINE HCL 100 MG PO TABS: 100.0000 mg | ORAL_TABLET | Freq: Every day | ORAL | Status: DC

## 2020-07-19 MED ORDER — AMOXICILLIN-POT CLAVULANATE 875-125 MG PO TABS: 1.0000 | ORAL_TABLET | Freq: Two times a day (BID) | ORAL | 0 refills | Status: AC

## 2020-07-19 NOTE — Progress Notes (Signed)
FPTS Interim Progress Note  Plan was to discharge patient if passed voiding trial. He voided 600cc but at 300cc remaining in bladder.  Plan: Will do retrial in 1-2 hours with postvoid residual scan immediately after void. If <300cc, he can discharge home tonight or first thing in AM If >300cc, he will need foley replaced and follow up with urology outpatient for repeat voiding trial  Joana Reamer, DO 07/19/2020, 7:04 PM PGY-3, [facility] Family Medicine Service pager [phone]

## 2020-07-19 NOTE — Discharge Instructions (Signed)
It was agreed to be part of your care team while you are in the hospital!  You are admitted to the hospital with altered mental status that may have been due to alcohol use combined with excessive Flexeril use.  You are also found to have muscle breakdown, and were treated with IV fluids with improvement.  During your stay you also had urinary retention, which is likely due to the Flexeril use, and a catheter had to be placed to help remove the urine until your body was able to urinate on its own. You began to have a cough while hospitalized, and with the report that you had vomited while unconscious, there is concern for possible lung infection and you are being discharged on antibiotics.  New medications: 1. Augmentin: Take 1 tablet twice a day for 5 days

## 2020-07-19 NOTE — Progress Notes (Signed)
Contacted nurse about voiding trial.  He passed the voiding trial and voided 875 mL.  He had 0 mL on post void bladder scan.  He requested that he be discharged tonight.  Order has been placed for discharge.

## 2020-07-19 NOTE — Discharge Summary (Signed)
Family Medicine Teaching [facility] Discharge Summary  Patient name: Howard Miller Medical record number: [id number] Date of birth: January 22, 1971 Age: 49 y.o. Gender: male Date of Admission: 07/16/2020  Date of Discharge: 07/19/2020 Admitting Physician: Westley Chandler, MD  Primary Care Provider: Farris Has, MD Consultants: None  Indication for Hospitalization: AMS  Discharge Diagnoses/Problem List:  Drug overdose Rhabdomyolysis Urinary retention [place] HTN Prediabetes Chronic back pain Leukocytosis   Disposition: home with wife  Discharge Condition: stable  Discharge Exam:  Temp:  [97.6 F (36.4 C)-98.1 F (36.7 C)] 97.8 F (36.6 C) (09/30 7829) Pulse Rate:  [81-89] 87 (09/30 0608) Resp:  [18-19] 19 (09/30 5621) BP: (136-152)/(76-93) 136/91 (09/30 3086) SpO2:  [92 %-100 %] 95 % (09/30 5784) Weight:  [143.9 kg] 143.9 kg (09/30 6962) Physical Exam: General: Laying in bed, NAD Cardiovascular: RRR, no M/R/G appreciated, no peripheral edema Respiratory: Normal WOB, no wheezes/rales/rhonchi appreciated Abdomen: Soft, non-tender, non-distended, obese Neuro: CN II through XII intact, brisk reflexes in b/l LE, A&O x4, answering questions appropriately   Brief Hospital Course:  Howard Miller is a 49 y.o. male with past medical history significant for chronic back pain and HTN, who presented with AMS in setting of alcohol abuse and possible cyclobenzaprine overdose.   Cyclobenzaprine toxicity causing encephalopathy, improving Patient was admitted with AMS in the reported setting of taking an unknown amount of Flexeril and binge drinking alcohol. He was found to have hyperreflexia and urinary retention but no signs of acute serotonin syndrome. Poison control was consulted with recommendation for q12 EKG which did not demonstrate widened QRS throughout admission. CT head was negative and MRI was negative for acute pathology. UDS positive for THC otherwise tylenol, salicylate, and  ethanol negative.  Patient was aggressively fluid resuscitated with improvement. By day 2 of admission, his mentation had significantly improved and by day of discharge he had returned to baseline.    Abnormal MRI:  In process of AMS workup, MRI was obtained and was noted for an incidental finding of few nonspecific right frontal white matter flair hyperintensities of unclear significance. Differential for this included chronic microvascular ischemic changes, post infectious/inflammatory sequela, migraines, and demyelination. He was discahrged home with recommendation to follow up with PCP and obtain referral to neurology for furthere valuation of MRI findings.   Rhabdomyolysis Patient was alone while altered for several hours, CK levels were obtained and were noted to be elevated at over 8000 with accompanied elevated AST/ALT.  Patient was aggressively fluid resuscitated with improvement in downtrending labs.  Rhabdomyolysis was likely secondary to cyclobenzaprine use combined with being down for several hours.   Urinary retention Patient found to have urinary retention on bladder scan.  Likely related to the anticholinergic properties of cyclobenzaprine in the setting of overdose.  Foley was placed. Voiding trail attempted on day of discharge which was successful. HE was recommended to follow up with PCP in 2-3 day, sooner if difficulty with urination.   Cough During his hospital stay, patient was found to have new cough with deep inspiration. In setting of potential overdose and emesis prior to admission, there was concern for aspirational pneumonia.  WBC was 20 on arrival, in the ED patient was given 1 dose of vancomycin and Zosyn.  CXR on admission showed pulmonary findings consistent with volume overload/CHF however echo was notable for EF of 60-65% with normal function. Although afebrile and hemodynamically stable, given patient's leukocytosis and new cough, patient was treated with 5 day course  of Augmentin.  Hyperglycemia, stable On admission patient's blood glucose was 136.  HbA1c was obtained at 5.6, not on any medications.  Glucose was monitored, no intervention was necessary.  Needs outpatient follow-up.   Chronic back pain Patient chronically takes Tylenol, was taking occasional flexiril.  Due to downtrending LFTs and CK, patient was given low-dose Tylenol for pain relief. He was recommended that he discontinue Flexeril and follow up with PCP for further management.  Issues for Follow Up:  1. MR finding: on flair sequence. If patient has not returned to neurologic baseline, recommend continued outpatient work up. 2. Recommended assess signs of depression and treating if indicated   Significant Procedures: MRI, CT head  Significant Labs and Imaging:  Recent Labs  Lab 07/17/20 0327 07/18/20 1449 07/19/20 0341  WBC 17.7* 8.1 6.5  HGB 14.5 12.0* 12.0*  HCT 44.6 38.1* 37.6*  PLT 242 214 217   Recent Labs  Lab 07/16/20 2125 07/16/20 2125 07/16/20 2236 07/16/20 2236 07/17/20 0327 07/17/20 0327 07/18/20 0658 07/19/20 0341  NA 137  --  138  --  136  --  138 140  K 4.6   < > 4.0   < > 3.6   < > 3.6 3.9  CL 103  --   --   --  101  --  104 106  CO2 20*  --   --   --  22  --  25 25  GLUCOSE 137*  --   --   --  140*  --  95 91  BUN 23*  --   --   --  22*  --  16 10  CREATININE 1.07  --   --   --  1.03  --  0.85 0.90  CALCIUM 9.9  --   --   --  9.4  --  8.1* 8.4*  MG 1.9  --   --   --   --   --   --   --   ALKPHOS 76  --   --   --  73  --  57 55  AST 169*  --   --   --  160*  --  88* 68*  ALT 65*  --   --   --  66*  --  53* 50*  ALBUMIN 3.9  --   --   --  3.7  --  3.0* 3.0*   < > = values in this interval not displayed.   Ammonia 28 TSH 2.809 Ethanol <10 Salicylate <7 Acetaminophen <10 Mag 1.9 COVID/Flu neg Blood culture: 1 of 2 (+) staph epidermidis, likely contaminant  HIV NR Hgb A1C: 5.6 CK 8530>5805>2969>2101  Urinalysis    Component Value  Date/Time   COLORURINE YELLOW 07/17/2020 1730   APPEARANCEUR HAZY (A) 07/17/2020 1730   LABSPEC 1.021 07/17/2020 1730   PHURINE 5.0 07/17/2020 1730   GLUCOSEU NEGATIVE 07/17/2020 1730   HGBUR LARGE (A) 07/17/2020 1730   BILIRUBINUR NEGATIVE 07/17/2020 1730   KETONESUR NEGATIVE 07/17/2020 1730   PROTEINUR 30 (A) 07/17/2020 1730   NITRITE NEGATIVE 07/17/2020 1730   LEUKOCYTESUR NEGATIVE 07/17/2020 1730   Drugs of Abuse     Component Value Date/Time   LABOPIA NONE DETECTED 07/17/2020 1730   COCAINSCRNUR NONE DETECTED 07/17/2020 1730   LABBENZ NONE DETECTED 07/17/2020 1730   AMPHETMU NONE DETECTED 07/17/2020 1730   THCU POSITIVE (A) 07/17/2020 1730   LABBARB NONE DETECTED 07/17/2020 1730    CT Head Wo Contrast  Result Date:  07/16/2020 CLINICAL DATA:  Altered mental status. EXAM: CT HEAD WITHOUT CONTRAST TECHNIQUE: Contiguous axial images were obtained from the base of the skull through the vertex without intravenous contrast. COMPARISON:  None. FINDINGS: Brain: No evidence of acute infarction, hemorrhage, hydrocephalus, extra-axial collection or mass lesion/mass effect. Vascular: No hyperdense vessel or unexpected calcification. Skull: Normal. Negative for fracture or focal lesion. Sinuses/Orbits: There is mild to moderate severity right maxillary sinus mucosal thickening. Other: None. IMPRESSION: 1. No acute intracranial abnormality. 2. Mild to moderate severity right maxillary sinus disease. Electronically Signed   By: Aram Candela M.D.   On: 07/16/2020 22:34   MR BRAIN WO CONTRAST  Result Date: 07/17/2020 CLINICAL DATA:  Mental status change EXAM: MRI HEAD WITHOUT CONTRAST TECHNIQUE: Multiplanar, multiecho pulse sequences of the brain and surrounding structures were obtained without intravenous contrast. COMPARISON:  07/16/2020 head CT and prior. FINDINGS: Please note motion artifact limits evaluation. Brain: No diffusion-weighted signal abnormality. No intracranial hemorrhage. Few  scattered T2/FLAIR hyperintense foci involving the right frontal subcortical white matter. No midline shift, ventriculomegaly or extra-axial fluid collection. No mass lesion. Vascular: Normal flow voids. Skull and upper cervical spine: Normal marrow signal. Sinuses/Orbits: Normal orbits. Ethmoid and right maxillary sinus mucosal thickening. No mastoid effusion. Other: None. IMPRESSION: No acute intracranial process. Few nonspecific right frontal white matter FLAIR hyperintensities. Differential includes chronic microvascular ischemic changes, post infectious/inflammatory sequela, migraines and demyelination. Electronically Signed   By: Stana Bunting M.D.   On: 07/17/2020 12:44   DG Chest Portable 1 View  Result Date: 07/16/2020 CLINICAL DATA:  Altered mental status for over 24 hours, last known well last night EXAM: PORTABLE CHEST 1 VIEW COMPARISON:  Radiograph 08/31/2017 FINDINGS: Low lung volumes. Diffuse hazy interstitial opacities with markedly cephalized indistinct pulmonary vascularity. Prominence of the cardiac silhouette is noted though possibly accentuated by low volumes and portable technique. No pneumothorax or visible effusion. No acute osseous or soft tissue abnormality. Degenerative changes are present in the imaged spine and shoulders. Telemetry leads overlie the chest. IMPRESSION: Low volumes and atelectatic change. Additional features consistent with mild CHF/volume overload with pulmonary congestion, possible cardiomegaly, and hazy interstitial opacities suggestive of interstitial edema. Electronically Signed   By: Kreg Shropshire M.D.   On: 07/16/2020 22:29   ECHOCARDIOGRAM COMPLETE  Result Date: 07/18/2020    ECHOCARDIOGRAM REPORT   Patient Name:   ASHTIAN VILLACIS Hartney Date of Exam: 07/18/2020 Medical Rec #:  [id number]     Height:       74.0 in Accession #:    [id number]    Weight:       314.0 lb Date of Birth:  07/09/1971      BSA:          2.633 m Patient Age:    49 years      BP:            150/92 mmHg Patient Gender: M             HR:           87 bpm. Exam Location:  Inpatient Procedure: 2D Echo, Cardiac Doppler, Color Doppler and Intracardiac            Opacification Agent Indications:    I50.23 Acute on chronic systolic (congestive) heart failure  History:        Patient has no prior history of Echocardiogram examinations.  Sonographer:    Leta Jungling RDCS Referring Phys: [id number] CARINA M BROWN  Sonographer Comments: No subcostal window.  IMPRESSIONS  1. Left ventricular ejection fraction, by estimation, is 60 to 65%. The left ventricle has normal function. The left ventricle has no regional wall motion abnormalities. There is moderate left ventricular hypertrophy. Left ventricular diastolic parameters are consistent with Grade I diastolic dysfunction (impaired relaxation).  2. Right ventricular systolic function is normal. The right ventricular size is normal.  3. The mitral valve is normal in structure. No evidence of mitral valve regurgitation. No evidence of mitral stenosis.  4. The aortic valve is tricuspid. Aortic valve regurgitation is not visualized. Mild aortic valve sclerosis is present, with no evidence of aortic valve stenosis.  5. Aortic dilatation noted. There is mild dilatation at the level of the sinuses of Valsalva, measuring 42 mm. There is normal dilatation of the ascending aorta, measuring 34 mm.  6. The inferior vena cava is normal in size with greater than 50% respiratory variability, suggesting right atrial pressure of 3 mmHg. FINDINGS  Left Ventricle: Left ventricular ejection fraction, by estimation, is 60 to 65%. The left ventricle has normal function. The left ventricle has no regional wall motion abnormalities. Definity contrast agent was given IV to delineate the left ventricular  endocardial borders. The left ventricular internal cavity size was normal in size. There is moderate left ventricular hypertrophy. Left ventricular diastolic parameters are consistent with  Grade I diastolic dysfunction (impaired relaxation). Right Ventricle: The right ventricular size is normal. No increase in right ventricular wall thickness. Right ventricular systolic function is normal. Left Atrium: Left atrial size was normal in size. Right Atrium: Right atrial size was normal in size. Pericardium: There is no evidence of pericardial effusion. Mitral Valve: The mitral valve is normal in structure. No evidence of mitral valve regurgitation. No evidence of mitral valve stenosis. Tricuspid Valve: The tricuspid valve is normal in structure. Tricuspid valve regurgitation is not demonstrated. No evidence of tricuspid stenosis. Aortic Valve: The aortic valve is tricuspid. Aortic valve regurgitation is not visualized. Mild aortic valve sclerosis is present, with no evidence of aortic valve stenosis. Pulmonic Valve: The pulmonic valve was normal in structure. Pulmonic valve regurgitation is not visualized. No evidence of pulmonic stenosis. Aorta: Aortic dilatation noted. There is mild dilatation at the level of the sinuses of Valsalva, measuring 42 mm. There is normal dilatation of the ascending aorta, measuring 34 mm. Venous: The inferior vena cava is normal in size with greater than 50% respiratory variability, suggesting right atrial pressure of 3 mmHg. IAS/Shunts: No atrial level shunt detected by color flow Doppler.  LEFT VENTRICLE PLAX 2D LVIDd:         4.20 cm      Diastology LVIDs:         2.60 cm      LV e' medial:    5.44 cm/s LV PW:         1.20 cm      LV E/e' medial:  9.4 LV IVS:        1.55 cm      LV e' lateral:   8.92 cm/s LVOT diam:     2.30 cm      LV E/e' lateral: 5.8 LV SV:         69 LV SV Index:   26 LVOT Area:     4.15 cm  LV Volumes (MOD) LV vol d, MOD A2C: 173.0 ml LV vol d, MOD A4C: 131.0 ml LV vol s, MOD A2C: 46.8 ml LV vol s, MOD A4C: 52.1 ml LV SV MOD A2C:  126.2 ml LV SV MOD A4C:     131.0 ml LV SV MOD BP:      102.9 ml RIGHT VENTRICLE RV S prime:     19.10 cm/s TAPSE  (M-mode): 2.5 cm LEFT ATRIUM           Index LA diam:      3.70 cm 1.41 cm/m LA Vol (A2C): 28.9 ml 10.98 ml/m LA Vol (A4C): 54.0 ml 20.51 ml/m  AORTIC VALVE LVOT Vmax:   94.00 cm/s LVOT Vmean:  61.600 cm/s LVOT VTI:    0.165 m  AORTA Ao Root diam: 4.20 cm MITRAL VALVE MV Area (PHT): 3.46 cm    SHUNTS MV Decel Time: 219 msec    Systemic VTI:  0.16 m MV E velocity: 51.40 cm/s  Systemic Diam: 2.30 cm MV A velocity: 76.30 cm/s MV E/A ratio:  0.67 Donato SchultzMark Skains MD Electronically signed by Donato SchultzMark Skains MD Signature Date/Time: 07/18/2020/5:32:48 PM    Final    Results/Tests Pending at Time of Discharge:   Discharge Medications:  Allergies as of 07/19/2020   No Known Allergies     Medication List    STOP taking these medications   acetaminophen 500 MG tablet Commonly known as: TYLENOL   cyclobenzaprine 10 MG tablet Commonly known as: FLEXERIL   ibuprofen 200 MG tablet Commonly known as: ADVIL     TAKE these medications   amoxicillin-clavulanate 875-125 MG tablet Commonly known as: AUGMENTIN Take 1 tablet by mouth every 12 (twelve) hours for 5 days. Take next tablet tonight (9/30)   folic acid 1 MG tablet Commonly known as: FOLVITE Take 1 tablet (1 mg total) by mouth daily. Start taking on: July 20, 2020   thiamine 100 MG tablet Take 1 tablet (100 mg total) by mouth daily. Start taking on: July 20, 2020       Discharge Instructions: Please refer to Patient Instructions section of EMR for full details.  Patient was counseled important signs and symptoms that should prompt return to medical care, changes in medications, dietary instructions, activity restrictions, and follow up appointments.   Follow-Up Appointments:  Follow-up Information    Farris HasMorrow, Aaron, MD. Schedule an appointment as soon as possible for a visit in 3 day(s).   Specialty: Family Medicine Why: hospital follow up Contact information: [address] [place] [address] [phone]                Evelena LeydenLilland, Alana, DO 07/19/2020, 5:17 PM PGY-1, [facility]  Orpah CobbKiersten Mullis, DO [facility], PGY3 07/19/2020 5:17 PM

## 2020-07-19 NOTE — Progress Notes (Signed)
Family Medicine Teaching Service Daily Progress Note Intern Pager: [phone]  Patient name: Howard Miller Medical record number: [id number] Date of birth: 29-Jun-1971 Age: 49 y.o. Gender: male  Primary Care Provider: Farris Has, MD Consultants: None Code Status: Full  Pt Overview and Major Events to Date:  9/29: Admitted for possible cyclobenzaprine OD  Assessment and Plan: Howard Miller is a 49 y.o. male who presented with AMS. PMH significant for chronic pain with degenerative disc disease.  Cyclobenzaprine toxicity causing encephalopathy, improving Patient states he took 10 doses of Flexeril after being intoxicated at a party, states OD was unintentional and has no SI. Brisk reflexes in b/l LE, patient A&O x4, strength 5+ in b/l UE and LE.  UDS positive for THC, UA shows large Hgb. ECG this morning showed normal QRS. - Continuous cardiac monitoring - Continuous pulse ox - Per poison control, if QRS >120 given sodium 1-47mEq/kg as a push then repeat EKG to see if QRS narrowing. Continue to f/u with poison control.  - Monitor temperature for hyperthermia, cooling measures if necessary - Monitor UOP, strict I's/O's - mIVF156mL/hr  Rhabdomyolysis Likely secondary to cyclobenzaprine as well as possibly having been down for several hours before admission.  CK trend 8530> 5805> 2969>2101. BUN 10, Cr 0.9, AST/ALT 88/53>68/50 - Continue mIVF at 154mL/hr, monitor for fluid overload  Urinary retention Likely related to anticholinergic properties of cyclobenzaprine relation to overdose.  -Foley in place -Voiding trial today  Cough WBC 6.5, pt s/p Vancomycin and Zoxyn x1 on admission. CXR on admission showed pulmonary findings consistent with volume overload/CHF so antibiotics were not continued.  Could also be due to volume overload, patient is being given aggressive fluids for rhabdomyolysis. -CBC today -Follow-up echo - Starting Augmentin for 5 day course  HTN, stable Patient has a  history of HTN, not on any medications.  Had elevated pressure to 122/98 overnight, currently normotensive. - Continue to monitor BP  Pre-diabetes, stable HbA1c 5.6.  No medications currently.  Blood glucose admission was 136, currently 95. -Continue to monitor  Chronic back pain Patient chronically takes Tylenol, was taking occasional flexiril. Patient complains of low back pain, CK and LFTs are downtrending. - Tylenol 325mg  PRN  Elevated WBC, resolved  FEN/GI: IVF NS @250  mL/h PPx: Lovenox   Status is: Med-surg  The patient will require care spanning > 2 midnights and should be moved to inpatient because: Inpatient level of care appropriate due to severity of illness  Dispo:  Patient From: Home  Planned Disposition: Home  Expected discharge date: 07/21/20  Medically stable for discharge: No    Subjective:  Patient reports that he is feeling better today, not quite back to baseline but he isn't really confused. Patient states that his strength has returned pretty well. States he is still having occasional cough with minimal sputum.  Objective: Temp:  [97.6 F (36.4 C)-98.1 F (36.7 C)] 97.8 F (36.6 C) (09/30 09/20/20) Pulse Rate:  [81-89] 87 (09/30 0608) Resp:  [18-19] 19 (09/30 0608) BP: (136-152)/(76-93) 136/91 (09/30 02-19-2006) SpO2:  [92 %-100 %] 95 % (09/30 2542) Weight:  [143.9 kg] 143.9 kg (09/30 7062) Physical Exam: General: Laying in bed, NAD Cardiovascular: RRR, no M/R/G appreciated, no peripheral edema Respiratory: Normal WOB, no wheezes/rales/rhonchi appreciated Abdomen: Soft, non-tender, non-distended, obese Neuro: CN II through XII intact, brisk reflexes in b/l LE, A&O x4, answering questions appropriately  Laboratory: Recent Labs  Lab 07/17/20 0327 07/18/20 1449 07/19/20 0341  WBC 17.7* 8.1 6.5  HGB 14.5  12.0* 12.0*  HCT 44.6 38.1* 37.6*  PLT 242 214 217   Recent Labs  Lab 07/17/20 0327 07/18/20 0658 07/19/20 0341  NA 136 138 140  K 3.6 3.6  3.9  CL 101 104 106  CO2 22 25 25   BUN 22* 16 10  CREATININE 1.03 0.85 0.90  CALCIUM 9.4 8.1* 8.4*  PROT 6.9 5.7* 5.9*  BILITOT 1.5* 1.0 1.0  ALKPHOS 73 57 55  ALT 66* 53* 50*  AST 160* 88* 68*  GLUCOSE 140* 95 91      Imaging/Diagnostic Tests: ECHOCARDIOGRAM COMPLETE  Result Date: 07/18/2020    ECHOCARDIOGRAM REPORT   Patient Name:   Howard Miller Date of Exam: 07/18/2020 Medical Rec #:  07/20/2020     Height:       74.0 in Accession #:    [id number]    Weight:       314.0 lb Date of Birth:  04/12/1971      BSA:          2.633 m Patient Age:    49 years      BP:           150/92 mmHg Patient Gender: M             HR:           87 bpm. Exam Location:  Inpatient Procedure: 2D Echo, Cardiac Doppler, Color Doppler and Intracardiac            Opacification Agent Indications:    I50.23 Acute on chronic systolic (congestive) heart failure  History:        Patient has no prior history of Echocardiogram examinations.  Sonographer:    03-09-1987 RDCS Referring Phys: Leta Jungling CARINA M BROWN  Sonographer Comments: No subcostal window. IMPRESSIONS  1. Left ventricular ejection fraction, by estimation, is 60 to 65%. The left ventricle has normal function. The left ventricle has no regional wall motion abnormalities. There is moderate left ventricular hypertrophy. Left ventricular diastolic parameters are consistent with Grade I diastolic dysfunction (impaired relaxation).  2. Right ventricular systolic function is normal. The right ventricular size is normal.  3. The mitral valve is normal in structure. No evidence of mitral valve regurgitation. No evidence of mitral stenosis.  4. The aortic valve is tricuspid. Aortic valve regurgitation is not visualized. Mild aortic valve sclerosis is present, with no evidence of aortic valve stenosis.  5. Aortic dilatation noted. There is mild dilatation at the level of the sinuses of Valsalva, measuring 42 mm. There is normal dilatation of the ascending aorta, measuring 34  mm.  6. The inferior vena cava is normal in size with greater than 50% respiratory variability, suggesting right atrial pressure of 3 mmHg. FINDINGS  Left Ventricle: Left ventricular ejection fraction, by estimation, is 60 to 65%. The left ventricle has normal function. The left ventricle has no regional wall motion abnormalities. Definity contrast agent was given IV to delineate the left ventricular  endocardial borders. The left ventricular internal cavity size was normal in size. There is moderate left ventricular hypertrophy. Left ventricular diastolic parameters are consistent with Grade I diastolic dysfunction (impaired relaxation). Right Ventricle: The right ventricular size is normal. No increase in right ventricular wall thickness. Right ventricular systolic function is normal. Left Atrium: Left atrial size was normal in size. Right Atrium: Right atrial size was normal in size. Pericardium: There is no evidence of pericardial effusion. Mitral Valve: The mitral valve is normal in structure. No evidence of  mitral valve regurgitation. No evidence of mitral valve stenosis. Tricuspid Valve: The tricuspid valve is normal in structure. Tricuspid valve regurgitation is not demonstrated. No evidence of tricuspid stenosis. Aortic Valve: The aortic valve is tricuspid. Aortic valve regurgitation is not visualized. Mild aortic valve sclerosis is present, with no evidence of aortic valve stenosis. Pulmonic Valve: The pulmonic valve was normal in structure. Pulmonic valve regurgitation is not visualized. No evidence of pulmonic stenosis. Aorta: Aortic dilatation noted. There is mild dilatation at the level of the sinuses of Valsalva, measuring 42 mm. There is normal dilatation of the ascending aorta, measuring 34 mm. Venous: The inferior vena cava is normal in size with greater than 50% respiratory variability, suggesting right atrial pressure of 3 mmHg. IAS/Shunts: No atrial level shunt detected by color flow Doppler.   LEFT VENTRICLE PLAX 2D LVIDd:         4.20 cm      Diastology LVIDs:         2.60 cm      LV e' medial:    5.44 cm/s LV PW:         1.20 cm      LV E/e' medial:  9.4 LV IVS:        1.55 cm      LV e' lateral:   8.92 cm/s LVOT diam:     2.30 cm      LV E/e' lateral: 5.8 LV SV:         69 LV SV Index:   26 LVOT Area:     4.15 cm  LV Volumes (MOD) LV vol d, MOD A2C: 173.0 ml LV vol d, MOD A4C: 131.0 ml LV vol s, MOD A2C: 46.8 ml LV vol s, MOD A4C: 52.1 ml LV SV MOD A2C:     126.2 ml LV SV MOD A4C:     131.0 ml LV SV MOD BP:      102.9 ml RIGHT VENTRICLE RV S prime:     19.10 cm/s TAPSE (M-mode): 2.5 cm LEFT ATRIUM           Index LA diam:      3.70 cm 1.41 cm/m LA Vol (A2C): 28.9 ml 10.98 ml/m LA Vol (A4C): 54.0 ml 20.51 ml/m  AORTIC VALVE LVOT Vmax:   94.00 cm/s LVOT Vmean:  61.600 cm/s LVOT VTI:    0.165 m  AORTA Ao Root diam: 4.20 cm MITRAL VALVE MV Area (PHT): 3.46 cm    SHUNTS MV Decel Time: 219 msec    Systemic VTI:  0.16 m MV E velocity: 51.40 cm/s  Systemic Diam: 2.30 cm MV A velocity: 76.30 cm/s MV E/A ratio:  0.67 Donato Schultz MD Electronically signed by Donato Schultz MD Signature Date/Time: 07/18/2020/5:32:48 PM    Final      Evelena Leyden, DO 07/19/2020, 9:28 AM PGY-1, [facility] FPTS Intern pager: [phone], text pages welcome

## 2020-07-19 NOTE — Progress Notes (Signed)
Discharge review done with patient.  He acknowledged understanding of information provided. Patient is stable; wife is coming to pick patient up.

## 2020-07-19 NOTE — Evaluation (Signed)
Physical Therapy Evaluation Patient Details Name: Howard Miller MRN: [id number] DOB: Feb 26, 1971 Today's Date: 07/19/2020   History of Present Illness  49 year old gentleman with history significant for obesity and chronic back pain presenting with altered mental status and signs of serotonin syndrome secondary to likely excess ingestion of cyclobenzaprine. Pt admitted 07/16/20 for treatment of AMS/accidental overdose and rhabdomyolysis.   Clinical Impression  Patient evaluated by Physical Therapy with no further acute PT needs identified. Pt with 23/24 score on DGI, indicating pt not at increased risk of falling. All education has been completed and the patient has no further questions. Pt has no further follow-up Physical Therapy or equipment needs. PT is signing off. Thank you for this referral.     Follow Up Recommendations No PT follow up    Equipment Recommendations  None recommended by PT       Precautions / Restrictions Precautions Precautions: None Restrictions Weight Bearing Restrictions: No      Mobility  Bed Mobility Overal bed mobility: Modified Independent             General bed mobility comments: HoB elevated use of bedrail   Transfers Overall transfer level: Modified independent               General transfer comment: increased time and effort   Ambulation/Gait Ambulation/Gait assistance: Modified independent (Device/Increase time) Gait Distance (Feet): 450 Feet Assistive device: None Gait Pattern/deviations: Step-through pattern Gait velocity: slowed   General Gait Details: slow, steady gait  Stairs Stairs: Yes Stairs assistance: Modified independent (Device/Increase time) Stair Management: Two rails;Alternating pattern;Forwards;One rail Left Number of Stairs: 12 General stair comments: use of L rail ascending stairs, use of bilateral rails on descent  Wheelchair Mobility    Modified Rankin (Stroke Patients Only)       Balance  Overall balance assessment: Modified Independent                               Standardized Balance Assessment Standardized Balance Assessment : Dynamic Gait Index   Dynamic Gait Index Level Surface: Normal Change in Gait Speed: Normal Gait with Horizontal Head Turns: Normal Gait with Vertical Head Turns: Normal Gait and Pivot Turn: Normal Step Over Obstacle: Normal Step Around Obstacles: Normal Steps: Mild Impairment Total Score: 23       Pertinent Vitals/Pain Pain Assessment: Faces Faces Pain Scale: Hurts a little bit Pain Location: R arm, IV infiltrate? Pain Descriptors / Indicators: Throbbing;Tightness;Sore Pain Intervention(s): Limited activity within patient's tolerance;Monitored during session;Repositioned    Home Living Family/patient expects to be discharged to:: Private residence Living Arrangements: Spouse/significant other Available Help at Discharge: Family Type of Home: House Home Access: Stairs to enter   Secretary/administrator of Steps: 7 Home Layout: Multi-level;Bed/bath upstairs Home Equipment: Cane - single point      Prior Function Level of Independence: Independent         Comments: driving     Hand Dominance   Dominant Hand: Right    Extremity/Trunk Assessment   Upper Extremity Assessment Upper Extremity Assessment: RUE deficits/detail RUE Deficits / Details: R UE reddened and swollen Slight diffrence in R versus L elbow flex/ext and grip strength     Lower Extremity Assessment Lower Extremity Assessment: Overall WFL for tasks assessed    Cervical / Trunk Assessment Cervical / Trunk Assessment: Other exceptions Cervical / Trunk Exceptions: DDD  Communication   Communication: No difficulties  Cognition Arousal/Alertness: Awake/alert Behavior During  Therapy: WFL for tasks assessed/performed;Impulsive Overall Cognitive Status: Within Functional Limits for tasks assessed                                  General Comments: slightly impulsive with movement initially      General Comments General comments (skin integrity, edema, etc.): R UE red and swollen        Assessment/Plan    PT Assessment Patent does not need any further PT services  PT Problem List Decreased strength       PT Treatment Interventions      PT Goals (Current goals can be found in the Care Plan section)  Acute Rehab PT Goals Patient Stated Goal: go home PT Goal Formulation: With patient/family     AM-PAC PT "6 Clicks" Mobility  Outcome Measure Help needed turning from your back to your side while in a flat bed without using bedrails?: None Help needed moving from lying on your back to sitting on the side of a flat bed without using bedrails?: None Help needed moving to and from a bed to a chair (including a wheelchair)?: None Help needed standing up from a chair using your arms (e.g., wheelchair or bedside chair)?: None Help needed to walk in hospital room?: None Help needed climbing 3-5 steps with a railing? : None 6 Click Score: 24    End of Session Equipment Utilized During Treatment: Gait belt Activity Tolerance: Patient tolerated treatment well Patient left: in bed;with family/visitor present Nurse Communication: Mobility status PT Visit Diagnosis: Muscle weakness (generalized) (M62.81)    Time: 8119-1478 PT Time Calculation (min) (ACUTE ONLY): 22 min   Charges:   PT Evaluation $PT Eval Moderate Complexity: 1 Mod          Howard Miller PT, DPT Acute Rehabilitation Services Pager [phone] Office [phone]   Elon Alas Fleet 07/19/2020, 2:51 PM

## 2020-07-21 LAB — CULTURE, BLOOD (ROUTINE X 2)
Culture: NO GROWTH
Special Requests: ADEQUATE

## 2021-04-01 ENCOUNTER — Other Ambulatory Visit: Payer: Self-pay | Admitting: Physical Medicine and Rehabilitation

## 2021-04-01 DIAGNOSIS — R29898 Other symptoms and signs involving the musculoskeletal system: Secondary | ICD-10-CM

## 2021-04-01 DIAGNOSIS — M79604 Pain in right leg: Secondary | ICD-10-CM

## 2021-04-01 DIAGNOSIS — R200 Anesthesia of skin: Secondary | ICD-10-CM

## 2021-04-01 DIAGNOSIS — M545 Low back pain, unspecified: Secondary | ICD-10-CM

## 2021-04-01 DIAGNOSIS — R202 Paresthesia of skin: Secondary | ICD-10-CM

## 2021-04-09 ENCOUNTER — Other Ambulatory Visit: Payer: Self-pay

## 2021-04-09 ENCOUNTER — Ambulatory Visit
Admission: RE | Admit: 2021-04-09 | Discharge: 2021-04-09 | Disposition: A | Discharge status: Home / Self Care | Payer: BC Managed Care – PPO | Source: Ambulatory Visit | Attending: Physical Medicine and Rehabilitation | Admitting: Physical Medicine and Rehabilitation

## 2021-04-09 DIAGNOSIS — M545 Low back pain, unspecified: Secondary | ICD-10-CM

## 2021-04-09 DIAGNOSIS — R200 Anesthesia of skin: Secondary | ICD-10-CM

## 2021-04-09 DIAGNOSIS — R29898 Other symptoms and signs involving the musculoskeletal system: Secondary | ICD-10-CM

## 2021-04-09 DIAGNOSIS — M79604 Pain in right leg: Secondary | ICD-10-CM

## 2021-04-09 DIAGNOSIS — R202 Paresthesia of skin: Secondary | ICD-10-CM

## 2021-05-27 DIAGNOSIS — M48061 Spinal stenosis, lumbar region without neurogenic claudication: Secondary | ICD-10-CM | POA: Diagnosis not present

## 2021-05-27 DIAGNOSIS — M5416 Radiculopathy, lumbar region: Secondary | ICD-10-CM | POA: Diagnosis not present

## 2021-06-03 DIAGNOSIS — M48061 Spinal stenosis, lumbar region without neurogenic claudication: Secondary | ICD-10-CM | POA: Diagnosis not present

## 2021-06-03 DIAGNOSIS — M5416 Radiculopathy, lumbar region: Secondary | ICD-10-CM | POA: Diagnosis not present

## 2021-06-17 DIAGNOSIS — M48061 Spinal stenosis, lumbar region without neurogenic claudication: Secondary | ICD-10-CM | POA: Diagnosis not present

## 2021-06-29 DIAGNOSIS — S46812A Strain of other muscles, fascia and tendons at shoulder and upper arm level, left arm, initial encounter: Secondary | ICD-10-CM | POA: Diagnosis not present

## 2021-06-29 DIAGNOSIS — R0789 Other chest pain: Secondary | ICD-10-CM | POA: Diagnosis not present

## 2021-07-25 DIAGNOSIS — M791 Myalgia, unspecified site: Secondary | ICD-10-CM | POA: Diagnosis not present

## 2021-07-25 DIAGNOSIS — M542 Cervicalgia: Secondary | ICD-10-CM | POA: Diagnosis not present

## 2021-07-31 DIAGNOSIS — M5126 Other intervertebral disc displacement, lumbar region: Secondary | ICD-10-CM | POA: Diagnosis not present

## 2021-07-31 DIAGNOSIS — M47816 Spondylosis without myelopathy or radiculopathy, lumbar region: Secondary | ICD-10-CM | POA: Diagnosis not present

## 2021-07-31 DIAGNOSIS — M5416 Radiculopathy, lumbar region: Secondary | ICD-10-CM | POA: Diagnosis not present

## 2021-07-31 DIAGNOSIS — Z6841 Body Mass Index (BMI) 40.0 and over, adult: Secondary | ICD-10-CM | POA: Diagnosis not present

## 2021-12-03 DIAGNOSIS — M542 Cervicalgia: Secondary | ICD-10-CM | POA: Diagnosis not present

## 2021-12-03 DIAGNOSIS — M5416 Radiculopathy, lumbar region: Secondary | ICD-10-CM | POA: Diagnosis not present

## 2021-12-03 DIAGNOSIS — M48061 Spinal stenosis, lumbar region without neurogenic claudication: Secondary | ICD-10-CM | POA: Diagnosis not present

## 2021-12-13 DIAGNOSIS — M5416 Radiculopathy, lumbar region: Secondary | ICD-10-CM | POA: Diagnosis not present

## 2022-01-09 DIAGNOSIS — M546 Pain in thoracic spine: Secondary | ICD-10-CM | POA: Diagnosis not present

## 2022-01-09 DIAGNOSIS — M48061 Spinal stenosis, lumbar region without neurogenic claudication: Secondary | ICD-10-CM | POA: Diagnosis not present

## 2022-01-09 DIAGNOSIS — M5416 Radiculopathy, lumbar region: Secondary | ICD-10-CM | POA: Diagnosis not present

## 2022-03-30 DIAGNOSIS — L03115 Cellulitis of right lower limb: Secondary | ICD-10-CM | POA: Diagnosis not present

## 2022-04-07 DIAGNOSIS — R42 Dizziness and giddiness: Secondary | ICD-10-CM | POA: Diagnosis not present

## 2022-04-07 DIAGNOSIS — D649 Anemia, unspecified: Secondary | ICD-10-CM | POA: Diagnosis not present

## 2022-04-07 DIAGNOSIS — L0390 Cellulitis, unspecified: Secondary | ICD-10-CM | POA: Diagnosis not present

## 2022-05-08 DIAGNOSIS — H353131 Nonexudative age-related macular degeneration, bilateral, early dry stage: Secondary | ICD-10-CM | POA: Diagnosis not present

## 2022-05-15 DIAGNOSIS — D649 Anemia, unspecified: Secondary | ICD-10-CM | POA: Diagnosis not present

## 2022-08-12 DIAGNOSIS — M48061 Spinal stenosis, lumbar region without neurogenic claudication: Secondary | ICD-10-CM | POA: Diagnosis not present

## 2022-08-12 DIAGNOSIS — M5412 Radiculopathy, cervical region: Secondary | ICD-10-CM | POA: Diagnosis not present

## 2022-08-13 DIAGNOSIS — E782 Mixed hyperlipidemia: Secondary | ICD-10-CM | POA: Diagnosis not present

## 2022-08-13 DIAGNOSIS — R202 Paresthesia of skin: Secondary | ICD-10-CM | POA: Diagnosis not present

## 2022-08-13 DIAGNOSIS — R9431 Abnormal electrocardiogram [ECG] [EKG]: Secondary | ICD-10-CM | POA: Diagnosis not present

## 2022-08-13 DIAGNOSIS — R079 Chest pain, unspecified: Secondary | ICD-10-CM | POA: Diagnosis not present

## 2022-08-20 DIAGNOSIS — R519 Headache, unspecified: Secondary | ICD-10-CM | POA: Diagnosis not present

## 2022-08-20 DIAGNOSIS — G43909 Migraine, unspecified, not intractable, without status migrainosus: Secondary | ICD-10-CM | POA: Diagnosis not present

## 2022-08-22 DIAGNOSIS — M542 Cervicalgia: Secondary | ICD-10-CM | POA: Diagnosis not present

## 2022-08-27 DIAGNOSIS — M5416 Radiculopathy, lumbar region: Secondary | ICD-10-CM | POA: Diagnosis not present

## 2022-08-27 DIAGNOSIS — M48061 Spinal stenosis, lumbar region without neurogenic claudication: Secondary | ICD-10-CM | POA: Diagnosis not present

## 2022-09-05 NOTE — Progress Notes (Unsigned)
Cardiology Office Note:    Date:  09/05/2022   [name], DOB [birth year], MRN [id number]  PCP:  Howard Has, MD   [facility] Providers Cardiologist:  None { Click to update primary MD,subspecialty MD or APP then REFRESH:1}    Referring MD: Howard Patience, FNP   No chief complaint on file. ***  History of Present Illness:    Howard Miller is a 51 y.o. male with a hx of ***  Past Medical History:  Diagnosis Date   Bronchitis    DDD (degenerative disc disease), lumbar    low back   Incarcerated umbilical hernia    Migraines    Seasonal allergies     Past Surgical History:  Procedure Laterality Date   ELBOW SURGERY Left    HAND SURGERY Right    INSERTION OF MESH N/A 08/06/2018   Procedure: INSERTION OF MESH;  Surgeon: Howard Miyamoto, MD;  Location: [facility];  Service: General;  Laterality: N/A;   SHOULDER SURGERY Bilateral    UMBILICAL HERNIA REPAIR N/A 08/06/2018   Procedure: UMBILICAL HERNIA REPAIR WITH MESH;  Surgeon: Howard Miyamoto, MD;  Location: [facility];  Service: General;  Laterality: N/A;    Current Medications: No outpatient medications have been marked as taking for the 09/08/22 encounter (Appointment) with Howard Sprague, MD.     Allergies:   Patient Miller no known allergies.   Social History   Socioeconomic History   Marital status: Married    Spouse name: Not on file   Number of children: Not on file   Years of education: Not on file   Highest education level: Not on file  Occupational History   Not on file  Tobacco Use   Smoking status: Never   Smokeless tobacco: Former  Building services engineer Use: Never used  Substance and Sexual Activity   Alcohol use: Yes    Comment: social   Drug use: No   Sexual activity: Not on file  Other Topics Concern   Not on file  Social History Narrative   Not on file   Social Determinants of Health   Financial Resource Strain: Not on file   Food Insecurity: Not on file  Transportation Needs: Not on file  Physical Activity: Not on file  Stress: Not on file  Social Connections: Not on file     Family History: The patient's ***Family history is unknown by patient.  ROS:   Please see the history of present illness.    *** All other systems reviewed and are negative.  EKGs/Labs/Other Studies Reviewed:    The following studies were reviewed today: ***  EKG:  EKG is *** ordered today.  The ekg ordered today demonstrates ***  Recent Labs: No results found for requested labs within last 365 days.  Recent Lipid Panel No results found for: "CHOL", "TRIG", "HDL", "CHOLHDL", "VLDL", "LDLCALC", "LDLDIRECT"   Risk Assessment/Calculations:   {Does this patient have ATRIAL FIBRILLATION?[phone]}  No BP recorded.  {Refresh Note OR Click here to enter BP  :1}***         Physical Exam:    VS:  There were no vitals taken for this visit.    Wt Readings from Last 3 Encounters:  07/19/20 (!) 317 lb 3.9 oz (143.9 kg)  07/17/20 300 lb (136.1 kg)  08/06/18 (!) 318 lb 9 oz (144.5 kg)     GEN: *** Well nourished, well developed in no acute  distress HEENT: Normal NECK: No JVD; No carotid bruits LYMPHATICS: No lymphadenopathy CARDIAC: ***RRR, no murmurs, rubs, gallops RESPIRATORY:  Clear to auscultation without rales, wheezing or rhonchi  ABDOMEN: Soft, non-tender, non-distended MUSCULOSKELETAL:  No edema; No deformity  SKIN: Warm and dry NEUROLOGIC:  Alert and oriented x 3 PSYCHIATRIC:  Normal affect   ASSESSMENT:    No diagnosis found. PLAN:    In order of problems listed above:  ***      {Are you ordering a CV Procedure (e.g. stress test, cath, DCCV, TEE, etc)?   Press F2        :[id number]}    Medication Adjustments/Labs and Tests Ordered: Current medicines are reviewed at length with the patient today.  Concerns regarding medicines are outlined above.  No orders of the defined types were placed in this  encounter.  No orders of the defined types were placed in this encounter.   There are no Patient Instructions on file for this visit.   Signed, Howard Sprague, MD  09/05/2022 12:56 PM    [facility]

## 2022-09-08 ENCOUNTER — Observation Stay (HOSPITAL_COMMUNITY)
Admission: EM | Admit: 2022-09-08 | Discharge: 2022-09-08 | Disposition: A | Discharge status: Home / Self Care | Payer: Medicare Other | Attending: Cardiology | Admitting: Cardiology

## 2022-09-08 ENCOUNTER — Encounter (HOSPITAL_COMMUNITY): Payer: Self-pay

## 2022-09-08 ENCOUNTER — Ambulatory Visit: Payer: Medicare Other | Attending: Cardiology | Admitting: Cardiology

## 2022-09-08 ENCOUNTER — Emergency Department (HOSPITAL_COMMUNITY): Payer: Medicare Other

## 2022-09-08 ENCOUNTER — Encounter: Payer: Self-pay | Admitting: Cardiology

## 2022-09-08 ENCOUNTER — Encounter (HOSPITAL_COMMUNITY)
Admission: EM | Disposition: A | Discharge status: Home / Self Care | Payer: Self-pay | Source: Home / Self Care | Attending: Emergency Medicine

## 2022-09-08 ENCOUNTER — Other Ambulatory Visit: Payer: Self-pay

## 2022-09-08 ENCOUNTER — Ambulatory Visit (HOSPITAL_COMMUNITY): Admit: 2022-09-08 | Payer: Medicare Other | Admitting: Internal Medicine

## 2022-09-08 VITALS — BP 114/76 | HR 54 | Ht 73.0 in | Wt 263.0 lb

## 2022-09-08 DIAGNOSIS — F17290 Nicotine dependence, other tobacco product, uncomplicated: Secondary | ICD-10-CM | POA: Diagnosis not present

## 2022-09-08 DIAGNOSIS — M25519 Pain in unspecified shoulder: Secondary | ICD-10-CM | POA: Diagnosis not present

## 2022-09-08 DIAGNOSIS — R079 Chest pain, unspecified: Principal | ICD-10-CM | POA: Diagnosis present

## 2022-09-08 DIAGNOSIS — I200 Unstable angina: Secondary | ICD-10-CM | POA: Diagnosis present

## 2022-09-08 DIAGNOSIS — Z9889 Other specified postprocedural states: Secondary | ICD-10-CM

## 2022-09-08 DIAGNOSIS — E782 Mixed hyperlipidemia: Secondary | ICD-10-CM | POA: Diagnosis not present

## 2022-09-08 DIAGNOSIS — M549 Dorsalgia, unspecified: Secondary | ICD-10-CM | POA: Diagnosis not present

## 2022-09-08 DIAGNOSIS — R0789 Other chest pain: Secondary | ICD-10-CM | POA: Diagnosis not present

## 2022-09-08 DIAGNOSIS — R9431 Abnormal electrocardiogram [ECG] [EKG]: Secondary | ICD-10-CM

## 2022-09-08 DIAGNOSIS — M542 Cervicalgia: Secondary | ICD-10-CM | POA: Insufficient documentation

## 2022-09-08 DIAGNOSIS — R072 Precordial pain: Secondary | ICD-10-CM

## 2022-09-08 DIAGNOSIS — R0689 Other abnormalities of breathing: Secondary | ICD-10-CM | POA: Diagnosis not present

## 2022-09-08 DIAGNOSIS — I499 Cardiac arrhythmia, unspecified: Secondary | ICD-10-CM | POA: Diagnosis not present

## 2022-09-08 HISTORY — PX: LEFT HEART CATH AND CORONARY ANGIOGRAPHY: CATH118249

## 2022-09-08 LAB — CBC WITH DIFFERENTIAL/PLATELET
Abs Immature Granulocytes: 0.01 10*3/uL (ref 0.00–0.07)
Basophils Absolute: 0.1 10*3/uL (ref 0.0–0.1)
Basophils Relative: 1 %
Eosinophils Absolute: 0.2 10*3/uL (ref 0.0–0.5)
Eosinophils Relative: 3 %
HCT: 43.9 % (ref 39.0–52.0)
Hemoglobin: 14.8 g/dL (ref 13.0–17.0)
Immature Granulocytes: 0 %
Lymphocytes Relative: 24 %
Lymphs Abs: 2 10*3/uL (ref 0.7–4.0)
MCH: 30.1 pg (ref 26.0–34.0)
MCHC: 33.7 g/dL (ref 30.0–36.0)
MCV: 89.4 fL (ref 80.0–100.0)
Monocytes Absolute: 0.6 10*3/uL (ref 0.1–1.0)
Monocytes Relative: 8 %
Neutro Abs: 5.3 10*3/uL (ref 1.7–7.7)
Neutrophils Relative %: 64 %
Platelets: 230 10*3/uL (ref 150–400)
RBC: 4.91 MIL/uL (ref 4.22–5.81)
RDW: 15.5 % (ref 11.5–15.5)
WBC: 8.2 10*3/uL (ref 4.0–10.5)
nRBC: 0 % (ref 0.0–0.2)

## 2022-09-08 LAB — TROPONIN I (HIGH SENSITIVITY)
Troponin I (High Sensitivity): 5 ng/L (ref <18)
Troponin I (High Sensitivity): 5 ng/L (ref <18)

## 2022-09-08 LAB — COMPREHENSIVE METABOLIC PANEL
ALT: 29 U/L (ref 0–44)
AST: 21 U/L (ref 15–41)
Albumin: 3.9 g/dL (ref 3.5–5.0)
Alkaline Phosphatase: 63 U/L (ref 38–126)
Anion gap: 9 (ref 5–15)
BUN: 12 mg/dL (ref 6–20)
CO2: 24 mmol/L (ref 22–32)
Calcium: 9 mg/dL (ref 8.9–10.3)
Chloride: 106 mmol/L (ref 98–111)
Creatinine, Ser: 0.75 mg/dL (ref 0.61–1.24)
GFR, Estimated: >60 mL/min (ref >60)
Glucose, Bld: 113 mg/dL — ABNORMAL HIGH (ref 70–99)
Potassium: 4.1 mmol/L (ref 3.5–5.1)
Sodium: 139 mmol/L (ref 135–145)
Total Bilirubin: 0.6 mg/dL (ref 0.3–1.2)
Total Protein: 6.6 g/dL (ref 6.5–8.1)

## 2022-09-08 SURGERY — LEFT HEART CATH AND CORONARY ANGIOGRAPHY
Anesthesia: LOCAL

## 2022-09-08 MED ORDER — ACETAMINOPHEN 325 MG PO TABS: 650.0000 mg | ORAL_TABLET | ORAL | Status: DC | PRN

## 2022-09-08 MED ORDER — MIDAZOLAM HCL 2 MG/2ML IJ SOLN
INTRAMUSCULAR | Status: AC
  Filled 2022-09-08: qty 2

## 2022-09-08 MED ORDER — NITROGLYCERIN IN D5W 200-5 MCG/ML-% IV SOLN
0.0000 ug/min | INTRAVENOUS | Status: DC
  Administered 2022-09-08: 5 ug/min via INTRAVENOUS
  Filled 2022-09-08: qty 250

## 2022-09-08 MED ORDER — ASPIRIN 300 MG RE SUPP: 300.0000 mg | RECTAL | Status: DC

## 2022-09-08 MED ORDER — TIZANIDINE HCL 4 MG PO TABS: 2.0000 mg | ORAL_TABLET | Freq: Three times a day (TID) | ORAL | Status: DC | PRN

## 2022-09-08 MED ORDER — HEPARIN BOLUS VIA INFUSION
4000.0000 [IU] | Freq: Once | INTRAVENOUS | Status: AC
  Administered 2022-09-08: 4000 [IU] via INTRAVENOUS
  Filled 2022-09-08: qty 4000

## 2022-09-08 MED ORDER — HEPARIN (PORCINE) IN NACL 1000-0.9 UT/500ML-% IV SOLN
INTRAVENOUS | Status: AC
  Filled 2022-09-08: qty 1000

## 2022-09-08 MED ORDER — ASPIRIN 81 MG PO CHEW: 324.0000 mg | CHEWABLE_TABLET | ORAL | Status: DC

## 2022-09-08 MED ORDER — ALPRAZOLAM 0.25 MG PO TABS: 0.2500 mg | ORAL_TABLET | Freq: Two times a day (BID) | ORAL | Status: DC | PRN

## 2022-09-08 MED ORDER — ONDANSETRON HCL 4 MG/2ML IJ SOLN: 4.0000 mg | Freq: Four times a day (QID) | INTRAMUSCULAR | Status: DC | PRN

## 2022-09-08 MED ORDER — FENTANYL CITRATE (PF) 100 MCG/2ML IJ SOLN
INTRAMUSCULAR | Status: DC | PRN
  Administered 2022-09-08: 50 ug via INTRAVENOUS

## 2022-09-08 MED ORDER — SODIUM CHLORIDE 0.9 % IV SOLN: 250.0000 mL | INTRAVENOUS | Status: DC | PRN

## 2022-09-08 MED ORDER — HEPARIN (PORCINE) IN NACL 1000-0.9 UT/500ML-% IV SOLN
INTRAVENOUS | Status: DC | PRN
  Administered 2022-09-08 (×2): 500 mL

## 2022-09-08 MED ORDER — HEPARIN (PORCINE) 25000 UT/250ML-% IV SOLN
1300.0000 [IU]/h | INTRAVENOUS | Status: DC
  Administered 2022-09-08: 1300 [IU]/h via INTRAVENOUS
  Filled 2022-09-08: qty 250

## 2022-09-08 MED ORDER — HEPARIN SODIUM (PORCINE) 1000 UNIT/ML IJ SOLN
INTRAMUSCULAR | Status: AC
  Filled 2022-09-08: qty 10

## 2022-09-08 MED ORDER — MIDAZOLAM HCL 2 MG/2ML IJ SOLN
INTRAMUSCULAR | Status: DC | PRN
  Administered 2022-09-08: 2 mg via INTRAVENOUS

## 2022-09-08 MED ORDER — VERAPAMIL HCL 2.5 MG/ML IV SOLN
INTRAVENOUS | Status: DC | PRN
  Administered 2022-09-08: 10 mL via INTRA_ARTERIAL

## 2022-09-08 MED ORDER — NITROGLYCERIN 0.4 MG SL SUBL: 0.4000 mg | SUBLINGUAL_TABLET | SUBLINGUAL | Status: DC | PRN

## 2022-09-08 MED ORDER — SODIUM CHLORIDE 0.9 % WEIGHT BASED INFUSION: 1.0000 mL/kg/h | INTRAVENOUS | Status: DC

## 2022-09-08 MED ORDER — ZOLPIDEM TARTRATE 5 MG PO TABS: 5.0000 mg | ORAL_TABLET | Freq: Every evening | ORAL | Status: DC | PRN

## 2022-09-08 MED ORDER — PREGABALIN 25 MG PO CAPS
75.0000 mg | ORAL_CAPSULE | Freq: Two times a day (BID) | ORAL | Status: DC
  Administered 2022-09-08: 75 mg via ORAL
  Filled 2022-09-08: qty 3

## 2022-09-08 MED ORDER — FENTANYL CITRATE (PF) 100 MCG/2ML IJ SOLN
INTRAMUSCULAR | Status: AC
  Filled 2022-09-08: qty 2

## 2022-09-08 MED ORDER — IOHEXOL 350 MG/ML SOLN
INTRAVENOUS | Status: DC | PRN
  Administered 2022-09-08: 50 mL

## 2022-09-08 MED ORDER — VERAPAMIL HCL 2.5 MG/ML IV SOLN
INTRAVENOUS | Status: AC
  Filled 2022-09-08: qty 2

## 2022-09-08 MED ORDER — ASPIRIN 81 MG PO CHEW: 81.0000 mg | CHEWABLE_TABLET | ORAL | Status: DC

## 2022-09-08 MED ORDER — SODIUM CHLORIDE 0.9 % IV SOLN: INTRAVENOUS | Status: DC

## 2022-09-08 MED ORDER — LIDOCAINE HCL (PF) 1 % IJ SOLN
INTRAMUSCULAR | Status: DC | PRN
  Administered 2022-09-08: 2 mL via INTRADERMAL

## 2022-09-08 MED ORDER — SODIUM CHLORIDE 0.9 % WEIGHT BASED INFUSION: 3.0000 mL/kg/h | INTRAVENOUS | Status: AC

## 2022-09-08 MED ORDER — HYDRALAZINE HCL 20 MG/ML IJ SOLN: 10.0000 mg | INTRAMUSCULAR | Status: DC | PRN

## 2022-09-08 MED ORDER — SODIUM CHLORIDE 0.9% FLUSH: 3.0000 mL | Freq: Two times a day (BID) | INTRAVENOUS | Status: DC

## 2022-09-08 MED ORDER — LIDOCAINE HCL (PF) 1 % IJ SOLN
INTRAMUSCULAR | Status: AC
  Filled 2022-09-08: qty 30

## 2022-09-08 MED ORDER — HEPARIN SODIUM (PORCINE) 1000 UNIT/ML IJ SOLN
INTRAMUSCULAR | Status: DC | PRN
  Administered 2022-09-08: 6000 [IU] via INTRAVENOUS

## 2022-09-08 MED ORDER — ATORVASTATIN CALCIUM 40 MG PO TABS
40.0000 mg | ORAL_TABLET | Freq: Every day | ORAL | Status: DC
  Administered 2022-09-08: 40 mg via ORAL
  Filled 2022-09-08: qty 1

## 2022-09-08 MED ORDER — LABETALOL HCL 5 MG/ML IV SOLN: 10.0000 mg | INTRAVENOUS | Status: DC | PRN

## 2022-09-08 MED ORDER — SODIUM CHLORIDE 0.9% FLUSH: 3.0000 mL | INTRAVENOUS | Status: DC | PRN

## 2022-09-08 SURGICAL SUPPLY — 10 items
CATH 5FR JL3.5 JR4 ANG PIG MP (CATHETERS) IMPLANT
DEVICE RAD COMP TR BAND LRG (VASCULAR PRODUCTS) IMPLANT
GLIDESHEATH SLEND SS 6F .021 (SHEATH) IMPLANT
GUIDEWIRE INQWIRE 1.5J.035X260 (WIRE) IMPLANT
INQWIRE 1.5J .035X260CM (WIRE) ×1
KIT HEART LEFT (KITS) ×2 IMPLANT
PACK CARDIAC CATHETERIZATION (CUSTOM PROCEDURE TRAY) ×2 IMPLANT
SYR MEDRAD MARK 7 150ML (SYRINGE) ×2 IMPLANT
TRANSDUCER W/STOPCOCK (MISCELLANEOUS) ×2 IMPLANT
TUBING CIL FLEX 10 FLL-RA (TUBING) ×2 IMPLANT

## 2022-09-08 NOTE — Discharge Instructions (Signed)
Call [facility] at [phone] if any bleeding, swelling or drainage at cath site.  May shower, no tub baths for 48 hours for groin sticks. No lifting over 5 pounds for 3 days.  No Driving for 3 days   Heart Healthy Diet  Keep appt with orthopedic.

## 2022-09-08 NOTE — ED Notes (Signed)
Cath lab here to take pt.

## 2022-09-08 NOTE — ED Notes (Signed)
Pt called out to nurses station c/o L sided (rib) sharp CP that lasted approximately 1 min. Obtained EKG, primary RN aware, EKG given to MD and MD notified.

## 2022-09-08 NOTE — Patient Instructions (Signed)
Medication Instructions:   Your physician recommends that you continue on your current medications as directed. Please refer to the Current Medication list given to you today.  *If you need a refill on your cardiac medications before your next appointment, please call your pharmacy*    Follow-Up:  THIS WILL BE ARRANGED AT DISCHARGE FROM THE HOSPITAL  Other Instructions  YOU WILL BE GOING STRAIGHT OVER TO [facility] PER DR. Shari Prows FOR CHEST PAIN AND EKG CHANGES--OUR CARDIOLOGY TEAM WILL MEET YOU IN THE ER--YOU WILL BE GOING VIA EMS   Important Information About Sugar

## 2022-09-08 NOTE — ED Notes (Addendum)
Patient reports increased left shoulder pain, repeat EKG obtained. MD notified.

## 2022-09-08 NOTE — Progress Notes (Signed)
TR band removed; new dressing placed along w/ arm brace. No s/s of bleeding or hematoma. will continue to monitor.

## 2022-09-08 NOTE — H&P (Addendum)
Cardiology History and Physical:     Date:  09/08/2022    ID:  Howard Miller, DOB 1971/02/05, MRN [id number]   PCP:  Farris Has, MD              [facility] Providers Cardiologist:  None {     Referring MD: Camie Patience, FNP      History of Present Illness:     Howard Miller is a 51 y.o. male with a hx of migraines who was referred by Jorge Ny, FNP for further evaluation of chest pain and HLD.   Patient seen by Jorge Ny, FNP on 08/14/22. Note reviewed. The patient had been experiencing chest pain and tingling in his left shoulder/arm. Pain had been going on for years and was sharp in nature. Not triggered by exertion. There, trop T negative. Lipids notably for LDL 189, HDL 37, TG 247. Given symptoms, he was planned to see ortho as well as Cardiology for further evaluation.   He is accompanied by a family member. Today, the patient states that he is actively experiencing neck pain which he says has been persistent. This is localized to his central, upper back. He attributes his neck pain to chronic back issues "in 3 locations" of his back. Every day he wakes up in pain related to these chronic issues. He denies pain due to palpation of the neck. He denies any current chest pain, dizziness, SOB, and pleuritic pain.   He has experienced a couple instances of nonexertional sharp pain in his chest which were brief. Of note, he states this chest pain was distinct from his usual musculoskeletal pains. He also has left arm numbness that has been intermittent over the past several months. This seems to worsen with activity. No associated diaphoresis, nausea, or SOB.   He does try to stay active but is limited by his chronic back issues. Though he does endorse being able to complete his physical exercise and climb stairs.   Typically he may smoke 1 cigar occasionally. He denies cigarette use.   He denies any palpitations, lightheadedness, headaches, syncope, orthopnea,  or PND.    Of note, his initial ECG showed STE in I and aVL with inferior TWI which is different from priors. Given active neck pain and ECG changes, he was referred to the ED for further management.  In the ER, he is still having L arm numbness and pressure-type pain lower cervical area, but is not in acute distress.         Past Medical History:  Diagnosis Date   Bronchitis     DDD (degenerative disc disease), lumbar      low back   Incarcerated umbilical hernia     Migraines     Seasonal allergies             Past Surgical History:  Procedure Laterality Date   ELBOW SURGERY Left     HAND SURGERY Right     INSERTION OF MESH N/A 08/06/2018    Procedure: INSERTION OF MESH;  Surgeon: Abigail Miyamoto, MD;  Location: [facility];  Service: General;  Laterality: N/A;   SHOULDER SURGERY Bilateral     UMBILICAL HERNIA REPAIR N/A 08/06/2018    Procedure: UMBILICAL HERNIA REPAIR WITH MESH;  Surgeon: Abigail Miyamoto, MD;  Location:  SURGERY CENTER;  Service: General;  Laterality: N/A;      Current Medications: Active Medications      Current Meds  Medication Sig   acetaminophen (TYLENOL) 500 MG tablet Take 1,000 mg by mouth every 8 (eight) hours as needed.   atorvastatin (LIPITOR) 40 MG tablet Take 40 mg by mouth daily.   diclofenac (VOLTAREN) 75 MG EC tablet Take 75 mg by mouth 2 (two) times daily as needed.   pregabalin (LYRICA) 75 MG capsule Take 75 mg by mouth 2 (two) times daily.   tiZANidine (ZANAFLEX) 4 MG tablet Take 2 mg by mouth 3 (three) times daily as needed.        Allergies:   Patient has no known allergies.    Social History         Socioeconomic History   Marital status: Married      Spouse name: Not on file   Number of children: Not on file   Years of education: Not on file   Highest education level: Not on file  Occupational History   Not on file  Tobacco Use   Smoking status: Never   Smokeless tobacco: Former  HaematologistVaping Use    Vaping Use: Never used  Substance and Sexual Activity   Alcohol use: Yes      Comment: social   Drug use: No   Sexual activity: Not on file  Other Topics Concern   Not on file  Social History Narrative   Not on file    Social Determinants of Health    Financial Resource Strain: Not on file  Food Insecurity: Not on file  Transportation Needs: Not on file  Physical Activity: Not on file  Stress: Not on file  Social Connections: Not on file      Family History: The patient's Family history is unknown by patient.   ROS:     Review of Systems  Constitutional:  Negative for chills and fever.  HENT:  Negative for nosebleeds and tinnitus.   Eyes:  Negative for blurred vision and pain.  Respiratory:  Negative for cough, hemoptysis, shortness of breath and stridor.   Cardiovascular:  Negative for chest pain, palpitations, orthopnea, claudication, leg swelling and PND.  Gastrointestinal:  Negative for blood in stool, diarrhea, nausea and vomiting.  Genitourinary:  Negative for dysuria and hematuria.  Musculoskeletal:  Positive for back pain and neck pain. Negative for falls.  Neurological:  Positive for tingling. Negative for dizziness, loss of consciousness and headaches.  Psychiatric/Behavioral:  Negative for depression, hallucinations and substance abuse. The patient does not have insomnia.       EKGs/Labs/Other Studies Reviewed:     The following studies were reviewed today:   Echo  07/18/2020:  1. Left ventricular ejection fraction, by estimation, is 60 to 65%. The  left ventricle has normal function. The left ventricle has no regional  wall motion abnormalities. There is moderate left ventricular hypertrophy.  Left ventricular diastolic  parameters are consistent with Grade I diastolic dysfunction (impaired  relaxation).   2. Right ventricular systolic function is normal. The right ventricular  size is normal.   3. The mitral valve is normal in structure. No evidence  of mitral valve  regurgitation. No evidence of mitral stenosis.   4. The aortic valve is tricuspid. Aortic valve regurgitation is not  visualized. Mild aortic valve sclerosis is present, with no evidence of  aortic valve stenosis.   5. Aortic dilatation noted. There is mild dilatation at the level of the  sinuses of Valsalva, measuring 42 mm. There is normal dilatation of the  ascending aorta, measuring 34 mm.   6. The  inferior vena cava is normal in size with greater than 50%  respiratory variability, suggesting right atrial pressure of 3 mmHg.    EKG:  EKG has been personally reviewed. 09/08/2022:  Sinus bradycardia. LVH with strain. HR 54.   Recent Labs: No results found for requested labs within last 365 days.    Recent Lipid Panel Labs (Brief)  No results found for: "CHOL", "TRIG", "HDL", "CHOLHDL", "VLDL", "LDLCALC", "LDLDIRECT"       Risk Assessment/Calculations:                   Physical Exam:     VS:  BP 114/76   Pulse (!) 54   Ht 6\' 1"  (1.854 m)   Wt 263 lb (119.3 kg)   SpO2 97%   BMI 34.70 kg/m         Wt Readings from Last 3 Encounters:  09/08/22 263 lb (119.3 kg)  09/08/22 263 lb (119.3 kg)  07/19/20 (!) 317 lb 3.9 oz (143.9 kg)      GEN: Well nourished, well developed in no acute distress HEENT: Normal NECK: No JVD; No carotid bruits CARDIAC:Bradycardic, regular, no murmurs, rubs, gallops RESPIRATORY:  Clear to auscultation without rales, wheezing or rhonchi  ABDOMEN: Soft, non-tender, non-distended MUSCULOSKELETAL:  No edema; No deformity; No tenderness of neck/back with palpation  SKIN: Warm and dry NEUROLOGIC:  Alert and oriented x 3 PSYCHIATRIC:  Normal affect    ASSESSMENT:     1. Precordial pain   2. Abnormal ECG   3. Mixed hyperlipidemia     PLAN:     In order of problems listed above:   #Dynamic ECG Changes: #Neck Pain: #Atypical Chest Pain: Patient initially referred to Cardiology clinic for episodes of sharp, intermittent  chest pain and left arm numbness. The left arm numbness was made worse by exertion but the chest pain could occur at any time and did not correlate with activity. While these symptoms are atypical in nature, today he presented to clinic with ongoing neck pain. Again, he states this is not abnormal for him given chronic neck./back pain but is worse today. Initial ECG showed STE in the lateral leads and TWI in the inferior leads which looks significantly different from prior ECGs (last ECG in PCP office in 07/2022). Repeat ECG with improvement in dynamic changes, however, given neck pain, ECG changes and risk factors, the decision was made to transfer to [facility] for urgent evaluation. This was discussed with STEMI MD, Dr. [facility], who agrees with plan. Pt seen on arrival. ED made aware of transfer. -Transfer to [facility] for urgent evaluation -Cardiology team aware of transfer; please contact on arrival -Check Trops -Trend ECGs  -Give full dose ASA 325mg  once -Start heparin gtt for now -Nitro prn -Likely plan for cath pending above work-up   #HLD: LDL very elevated at 189 (? Familial). TG also elevated at 247. Merits cholesterol lowering therapy at this time. Recommend starting crestor. -Start crestor 20mg  daily -Check lipids in 6-8 weeks   Plan discussed at length with patient, his wife, Dr. [facility] and ER physician.   INFORMED CONSENT: I have reviewed the risks, indications, and alternatives to cardiac catheterization, possible angioplasty, and stenting with the patient. Risks include but are not limited to bleeding, infection, vascular injury, stroke, myocardial infection, arrhythmia, kidney injury, radiation-related injury in the case of prolonged fluoroscopy use, emergency cardiac surgery, and death. The patient understands the risks of serious complication is 1-2 in 1000 with diagnostic cardiac cath and  1-2% or less with angioplasty/stenting.      Theodore Demark, PA-C 09/08/2022 11:37 AM  Patient  seen and examined and agree with above. Please see clinic visit note from today.  Laurance Flatten, MD

## 2022-09-08 NOTE — ED Provider Notes (Signed)
[facility] EMERGENCY DEPARTMENT Provider Note   CSN: [id number] Arrival date & time: 09/08/22  0930     History Chief Complaint  Patient presents with   Chest Pain    Brooks Kinnan Mara is a 51 y.o. male with history of degenerative disc disease, bronchitis presents to the emergency room for evaluation of abnormal EKG.  Patient reports has been having posterior neck pain as well as left shoulder numbness and tingling off-and-on for the past 2 to 3 months.  He was seen by his primary care office and ultimately sent over to cardiologist for further evaluation.  Patient was seen in the office today cardiology and was sent over here via EMS due to some abnormal EKG findings.  He reports that the symptoms been going on for the past few months, nothing acute.  He reports he has felt more fatigued for the past few weeks.  Reports some shortness of breath, sometimes worsening upon exertion.  He denies any active chest pain right now.  Denies any abdominal pain, nausea, vomiting.   Chest Pain Associated symptoms: fatigue and numbness   Associated symptoms: no abdominal pain, no fever, no nausea, no shortness of breath and no vomiting        Home Medications Prior to Admission medications   Medication Sig Start Date End Date Taking? Authorizing Provider  acetaminophen (TYLENOL) 500 MG tablet Take 1,000 mg by mouth every 8 (eight) hours as needed.    [provider]  atorvastatin (LIPITOR) 40 MG tablet Take 40 mg by mouth daily. 08/14/22   [provider]  diclofenac (VOLTAREN) 75 MG EC tablet Take 75 mg by mouth 2 (two) times daily as needed. 09/03/22   [provider]  pregabalin (LYRICA) 75 MG capsule Take 75 mg by mouth 2 (two) times daily. 09/03/22   [provider]  tiZANidine (ZANAFLEX) 4 MG tablet Take 2 mg by mouth 3 (three) times daily as needed. 09/03/22   [provider]      Allergies    Patient has no known allergies.     Review of Systems   Review of Systems  Constitutional:  Positive for fatigue. Negative for chills and fever.  Respiratory:  Negative for shortness of breath.   Cardiovascular:  Negative for chest pain.  Gastrointestinal:  Negative for abdominal pain, nausea and vomiting.  Musculoskeletal:  Positive for arthralgias and neck pain.  Neurological:  Positive for numbness.    Physical Exam Updated Vital Signs BP (!) 127/91   Pulse (!) 52   Temp (!) 97.1 F (36.2 C) (Oral)   Resp 10   Ht 6\' 1"  (1.854 m)   Wt 119.3 kg   SpO2 98%   BMI 34.70 kg/m  Physical Exam Vitals and nursing note reviewed.  Constitutional:      General: He is not in acute distress.    Appearance: Normal appearance. He is not ill-appearing or toxic-appearing.  Eyes:     General: No scleral icterus. Cardiovascular:     Rate and Rhythm: Bradycardia present.  Pulmonary:     Effort: Pulmonary effort is normal. No respiratory distress.  Skin:    General: Skin is dry.     Findings: No rash.  Neurological:     General: No focal deficit present.     Mental Status: He is alert. Mental status is at baseline.  Psychiatric:        Mood and Affect: Mood normal.     ED Results /  Procedures / Treatments   Labs (all labs ordered are listed, but only abnormal results are displayed) Labs Reviewed  COMPREHENSIVE METABOLIC PANEL - Abnormal; Notable for the following components:      Result Value   Glucose, Bld 113 (*)    All other components within normal limits  CBC WITH DIFFERENTIAL/PLATELET  URINALYSIS, ROUTINE W REFLEX MICROSCOPIC  HEPARIN LEVEL (UNFRACTIONATED)  HIV ANTIBODY (ROUTINE TESTING W REFLEX)  TSH  HEMOGLOBIN A1C  TROPONIN I (HIGH SENSITIVITY)  TROPONIN I (HIGH SENSITIVITY)    EKG None  Radiology DG Chest 2 View  Result Date: 09/08/2022 CLINICAL DATA:  Chest pain EXAM: CHEST - 2 VIEW COMPARISON:  None Available. FINDINGS: The heart size and mediastinal contours are within normal limits.  Both lungs are clear. The visualized skeletal structures are unremarkable. IMPRESSION: No active cardiopulmonary disease. Electronically Signed   By: Lorenza Cambridge M.D.   On: 09/08/2022 10:42    Procedures Procedures   Medications Ordered in ED Medications - No data to display  ED Course/ Medical Decision Making/ A&P                           Medical Decision Making Amount and/or Complexity of Data Reviewed Labs: ordered. Radiology: ordered.  Risk Prescription drug management. Decision regarding hospitalization.   51 year old male presents the emergency room for evaluation of inferior neck pain and left arm numbness and tingling this been going on for around 2 to 3 months.  He was sent over here from cardiology due to his abnormal EKG findings.  Vital signs show blood pressure 127/91, temperature 97.1, bradycardia 52, otherwise satting well on room air without increased work of breathing.  Physical exam as noted above.  According to previous note from cardiology earlier today, the patient had Initial ECG showed STE in the lateral leads and TWI in the inferior leads which looks significantly different from prior ECGs (last ECG in PCP office in 07/2022). Repeat ECG with improvement in dynamic changes, however, given neck pain, ECG changes and risk factors, the decision was made to transfer to [facility] for urgent evaluation. Provider called the STEMI provider aware. Would like cardiology paged when he gets here. Likely cath. Labs ordered.   9:46 AM Page placed to cardiology.  10:01 AM Spoke with Dr. Royann Shivers who will come and evaluate the patient.   I independent reviewed and interpreted the patient's labs.  CBC without leukocytosis or anemia.  Initial troponin 5 with repeat pending.  CMP shows mildly increased glucose at 113 otherwise no electrolyte or LFT abnormality.  Chest x-ray shows no acute cardiopulmonary process.  According to Theodore Demark, PA-C note, the patient will be admitted.  He was started on ASA and heparin by the cardiology team.   Cardiology admitting.   Final Clinical Impression(s) / ED Diagnoses Final diagnoses:  Abnormal EKG    Rx / DC Orders ED Discharge Orders     None         Achille Rich, PA-C 09/08/22 1241    Melene Plan, DO 09/08/22 1243

## 2022-09-08 NOTE — Interval H&P Note (Signed)
History and Physical Interval Note:  09/08/2022 2:59 PM  Howard Miller  has presented today for surgery, with the diagnosis of nstemi.  The various methods of treatment have been discussed with the patient and family. After consideration of risks, benefits and other options for treatment, the patient has consented to  Procedure(s): LEFT HEART CATH AND CORONARY ANGIOGRAPHY (N/A) as a surgical intervention.  The patient's history has been reviewed, patient examined, no change in status, stable for surgery.  I have reviewed the patient's chart and labs.  Questions were answered to the patient's satisfaction.    Cath Lab Visit (complete for each Cath Lab visit)  Clinical Evaluation Leading to the Procedure:   ACS: No.  Non-ACS:    Anginal Classification: CCS II  Anti-ischemic medical therapy: No Therapy  Non-Invasive Test Results: No non-invasive testing performed  Prior CABG: No previous CABG        Verne Carrow

## 2022-09-08 NOTE — ED Notes (Signed)
Patient transported to XR. 

## 2022-09-08 NOTE — Progress Notes (Signed)
ANTICOAGULATION CONSULT NOTE - Initial Consult  Pharmacy Consult for heparin Indication: chest pain/ACS  No Known Allergies  Patient Measurements: Height: 6\' 1"  (185.4 cm) Weight: 119.3 kg (263 lb) IBW/kg (Calculated) : 79.9 Heparin Dosing Weight: 105kg  Vital Signs: Temp: 97.1 F (36.2 C) (11/20 0949) Temp Source: Oral (11/20 0949) BP: 117/87 (11/20 1130) Pulse Rate: 57 (11/20 1130)  Labs: Recent Labs    09/08/22 0952  HGB 14.8  HCT 43.9  PLT 230  CREATININE 0.75  TROPONINIHS 5    Estimated Creatinine Clearance: 144.5 mL/min (by C-G formula based on SCr of 0.75 mg/dL).   Medical History: Past Medical History:  Diagnosis Date   Bronchitis    DDD (degenerative disc disease), lumbar    low back   Incarcerated umbilical hernia    Migraines    Seasonal allergies     Assessment: 47 YOM presenting with CP, EKG changes, he is not on anticoagulation PTA  Goal of Therapy:  Heparin level 0.3-0.7 units/ml Monitor platelets by anticoagulation protocol: Yes   Plan:  Heparin 4000 units IV x 1, and gtt at 1300 units/hr F/u 6 hour heparin level F/u cath plans  40, PharmD Clinical Pharmacist ED Pharmacist Phone # [phone] 09/08/2022 11:45 AM

## 2022-09-08 NOTE — ED Triage Notes (Signed)
Pt arrived to ED via EMS from PCP w/ c/o intermittent mid sharp CP onset a couple weeks ago. Pt also c/o L shoulder pain and numbness radiating to L arm. Pt denies pain at this time. PCP obtained 2 EKGs, the 1st one was obtained after exertion and showed a questionable STEMI, the 2nd one after resting showed decreased elevation. Pt denies DOE, dizziness. EMS gave 324mg  ASA. VS: SB 52, 162/90, RR 20. 97% RA. A&Ox4.

## 2022-09-08 NOTE — Discharge Summary (Addendum)
Discharge Summary    Patient ID: BEN HABERMANN MRN: [id number]; DOB: [birth year]  Admit date: 09/08/2022 Discharge date: 09/08/2022  PCP:  Farris Has, MD    [facility] Providers Cardiologist:  Meriam Sprague, MD        Discharge Diagnoses    Principal Problem:   Chest pain of uncertain etiology Active Problems:   Nonspecific abnormal electrocardiogram (ECG) (EKG)   S/P cardiac cath patent coronary arteries    Diagnostic Studies/Procedures    Cardiac Cath 09/08/22    The left ventricular systolic function is normal.   LV end diastolic pressure is normal.   The left ventricular ejection fraction is 55-65% by visual estimate.   No angiographic evidence of CAD Normal LV filling pressures   Recommendations: No further ischemic workup. Discharge home today after two hours bedrest. Post cath follow up 2-3 weeks with Dr. Shari Prows or office APP.   Diagnostic Dominance: Right  _____________   History of Present Illness     Howard Miller is a 51 y.o. male with hx of migraines, was seen by Dr. Shari Prows today for chest pain and tingling in his left shoulder/arm.  Pain had been going on for years and was sharp in nature. Not triggered by exertion.  Pt was seen by cardiology and plan for ortho in near future.  On the office visit he had neck pain and occ sharp chest pain.    Of note, his initial ECG showed STE in I and aVL with inferior TWI which is different from priors. Given active neck pain and ECG changes, he was referred to the ED for further management.   Labs normal lytes, hepatic, CBC and hs troponin 5 and 5.  2V CXR NAD.  Plans were made for cardiac cath.   Hospital Course     Consultants: none  Pt underwent cardiac cath without complications and was found to have normal coronary arteries.  Very reassuring.  He did well post cath and is discharged home.  He will follow up with cardiology.  He should proceed to see ortho.    Discharged from Short  Stay      Did the patient have an acute coronary syndrome (MI, NSTEMI, STEMI, etc) this admission?:  No                               Did the patient have a percutaneous coronary intervention (stent / angioplasty)?:  No.          _____________  Discharge Vitals Blood pressure (!) 146/96, pulse (!) 54, temperature 97.8 F (36.6 C), temperature source Oral, resp. rate 19, height 6\' 1"  (1.854 m), weight 119.3 kg, SpO2 97 %.  Filed Weights   09/08/22 0949  Weight: 119.3 kg    Labs & Radiologic Studies    CBC Recent Labs    09/08/22 0952  WBC 8.2  NEUTROABS 5.3  HGB 14.8  HCT 43.9  MCV 89.4  PLT 230   Basic Metabolic Panel Recent Labs    09/10/22 0952  NA 139  K 4.1  CL 106  CO2 24  GLUCOSE 113*  BUN 12  CREATININE 0.75  CALCIUM 9.0   Liver Function Tests Recent Labs    09/08/22 0952  AST 21  ALT 29  ALKPHOS 63  BILITOT 0.6  PROT 6.6  ALBUMIN 3.9   No results for input(s): "LIPASE", "AMYLASE" in the last 72 hours. High Sensitivity  Troponin:   Recent Labs  Lab 09/08/22 0952 09/08/22 1132  TROPONINIHS 5 5    BNP Invalid input(s): "POCBNP" D-Dimer No results for input(s): "DDIMER" in the last 72 hours. Hemoglobin A1C No results for input(s): "HGBA1C" in the last 72 hours. Fasting Lipid Panel No results for input(s): "CHOL", "HDL", "LDLCALC", "TRIG", "CHOLHDL", "LDLDIRECT" in the last 72 hours. Thyroid Function Tests No results for input(s): "TSH", "T4TOTAL", "T3FREE", "THYROIDAB" in the last 72 hours.  Invalid input(s): "FREET3" _____________  CARDIAC CATHETERIZATION  Result Date: 09/08/2022   The left ventricular systolic function is normal.   LV end diastolic pressure is normal.   The left ventricular ejection fraction is 55-65% by visual estimate. No angiographic evidence of CAD Normal LV filling pressures Recommendations: No further ischemic workup. Discharge home today after two hours bedrest. Post cath follow up 2-3 weeks with Dr.  Shari Prows or office APP.   DG Chest 2 View  Result Date: 09/08/2022 CLINICAL DATA:  Chest pain EXAM: CHEST - 2 VIEW COMPARISON:  None Available. FINDINGS: The heart size and mediastinal contours are within normal limits. Both lungs are clear. The visualized skeletal structures are unremarkable. IMPRESSION: No active cardiopulmonary disease. Electronically Signed   By: Lorenza Cambridge M.D.   On: 09/08/2022 10:42   Disposition   Pt is being discharged home today in good condition.  Follow-up Plans & Appointments     Follow-up Information     Sharlene Dory, PA-C Follow up on 09/22/2022.   Specialty: Cardiology Why: at 2:20 pm Contact information: [address] [place] [address] [phone]                   Discharge Medications   Allergies as of 09/08/2022   No Known Allergies      Medication List     TAKE these medications    acetaminophen 500 MG tablet Commonly known as: TYLENOL Take 1,000 mg by mouth every 8 (eight) hours as needed for mild pain or headache.   atorvastatin 40 MG tablet Commonly known as: LIPITOR Take 40 mg by mouth daily.   diclofenac 75 MG EC tablet Commonly known as: VOLTAREN Take 75 mg by mouth 2 (two) times daily as needed for mild pain or moderate pain.   pregabalin 75 MG capsule Commonly known as: LYRICA Take 75 mg by mouth 2 (two) times daily.   tiZANidine 4 MG tablet Commonly known as: ZANAFLEX Take 2 mg by mouth 3 (three) times daily as needed for muscle spasms.           Outstanding Labs/Studies     Duration of Discharge Encounter   Greater than 30 minutes including physician time.  Signed, Nada Boozer, NP 09/08/2022, 4:55 PM   Patient seen and examined in clinic and agree with the plan as detailed above.  Laurance Flatten, MD

## 2022-09-08 NOTE — Progress Notes (Signed)
Cardiology Office Note:    Date:  09/08/2022   ID:  Howard Miller, DOB 27-Apr-1971, MRN [id number]  PCP:  Farris Has, MD   [facility] Providers Cardiologist:  None {   Referring MD: Camie Patience, FNP    History of Present Illness:    Howard Miller is a 51 y.o. male with a hx of migraines who was referred by Jorge Ny, FNP for further evaluation of chest pain and HLD.  Patient seen by Jorge Ny, FNP on 08/14/22. Note reviewed. The patient had been experiencing chest pain and tingling in his left shoulder/arm. Pain had been going on for years and was sharp in nature. Not triggered by exertion. There, trop T negative. Lipids notably for LDL 189, HDL 37, TG 247. Given symptoms, he was planned to see ortho as well as Cardiology for further evaluation.  He is accompanied by a family member. Today, the patient states that he is actively experiencing neck pain which he says has been persistent. This is localized to his central, upper back. He attributes his neck pain to chronic back issues "in 3 locations" of his back. Every day he wakes up in pain related to these chronic issues. He denies pain due to palpation of the neck. He denies any current chest pain, dizziness, SOB, and pleuritic pain.  He has experienced a couple instances of nonexertional sharp pain in his chest which were brief. Of note, he states this chest pain was distinct from his usual musculoskeletal pains. He also has left arm numbness that has been intermittent over the past several months. This seems to worsen with activity. No associated diaphoresis, nausea, or SOB.  He does try to stay active but is limited by his chronic back issues. Though he does endorse being able to complete his physical exercise and climb stairs.  Typically he may smoke 1 cigar occasionally. He denies cigarette use.  He denies any palpitations, lightheadedness, headaches, syncope, orthopnea, or PND.   Of note, his initial ECG  showed STE in I and aVL with inferior TWI which is different from priors. Given active neck pain and ECG changes, he was referred to the ED for further management.  Past Medical History:  Diagnosis Date   Bronchitis    DDD (degenerative disc disease), lumbar    low back   Incarcerated umbilical hernia    Migraines    Seasonal allergies     Past Surgical History:  Procedure Laterality Date   ELBOW SURGERY Left    HAND SURGERY Right    INSERTION OF MESH N/A 08/06/2018   Procedure: INSERTION OF MESH;  Surgeon: Abigail Miyamoto, MD;  Location: [facility];  Service: General;  Laterality: N/A;   SHOULDER SURGERY Bilateral    UMBILICAL HERNIA REPAIR N/A 08/06/2018   Procedure: UMBILICAL HERNIA REPAIR WITH MESH;  Surgeon: Abigail Miyamoto, MD;  Location: [facility];  Service: General;  Laterality: N/A;    Current Medications: Current Meds  Medication Sig   acetaminophen (TYLENOL) 500 MG tablet Take 1,000 mg by mouth every 8 (eight) hours as needed.   atorvastatin (LIPITOR) 40 MG tablet Take 40 mg by mouth daily.   diclofenac (VOLTAREN) 75 MG EC tablet Take 75 mg by mouth 2 (two) times daily as needed.   pregabalin (LYRICA) 75 MG capsule Take 75 mg by mouth 2 (two) times daily.   tiZANidine (ZANAFLEX) 4 MG tablet Take 2 mg by mouth 3 (three) times daily as needed.  Allergies:   Patient has no known allergies.   Social History   Socioeconomic History   Marital status: Married    Spouse name: Not on file   Number of children: Not on file   Years of education: Not on file   Highest education level: Not on file  Occupational History   Not on file  Tobacco Use   Smoking status: Never   Smokeless tobacco: Former  Building services engineer Use: Never used  Substance and Sexual Activity   Alcohol use: Yes    Comment: social   Drug use: No   Sexual activity: Not on file  Other Topics Concern   Not on file  Social History Narrative   Not on file    Social Determinants of Health   Financial Resource Strain: Not on file  Food Insecurity: Not on file  Transportation Needs: Not on file  Physical Activity: Not on file  Stress: Not on file  Social Connections: Not on file     Family History: The patient's Family history is unknown by patient.  ROS:    Review of Systems  Constitutional:  Negative for chills and fever.  HENT:  Negative for nosebleeds and tinnitus.   Eyes:  Negative for blurred vision and pain.  Respiratory:  Negative for cough, hemoptysis, shortness of breath and stridor.   Cardiovascular:  Negative for chest pain, palpitations, orthopnea, claudication, leg swelling and PND.  Gastrointestinal:  Negative for blood in stool, diarrhea, nausea and vomiting.  Genitourinary:  Negative for dysuria and hematuria.  Musculoskeletal:  Positive for back pain and neck pain. Negative for falls.  Neurological:  Positive for tingling. Negative for dizziness, loss of consciousness and headaches.  Psychiatric/Behavioral:  Negative for depression, hallucinations and substance abuse. The patient does not have insomnia.      EKGs/Labs/Other Studies Reviewed:    The following studies were reviewed today:  Echo  07/18/2020:  1. Left ventricular ejection fraction, by estimation, is 60 to 65%. The  left ventricle has normal function. The left ventricle has no regional  wall motion abnormalities. There is moderate left ventricular hypertrophy.  Left ventricular diastolic  parameters are consistent with Grade I diastolic dysfunction (impaired  relaxation).   2. Right ventricular systolic function is normal. The right ventricular  size is normal.   3. The mitral valve is normal in structure. No evidence of mitral valve  regurgitation. No evidence of mitral stenosis.   4. The aortic valve is tricuspid. Aortic valve regurgitation is not  visualized. Mild aortic valve sclerosis is present, with no evidence of  aortic valve stenosis.    5. Aortic dilatation noted. There is mild dilatation at the level of the  sinuses of Valsalva, measuring 42 mm. There is normal dilatation of the  ascending aorta, measuring 34 mm.   6. The inferior vena cava is normal in size with greater than 50%  respiratory variability, suggesting right atrial pressure of 3 mmHg.   EKG:  EKG has been personally reviewed. 09/08/2022:  Sinus bradycardia. LVH with strain. HR 54.  Recent Labs: No results found for requested labs within last 365 days.   Recent Lipid Panel No results found for: "CHOL", "TRIG", "HDL", "CHOLHDL", "VLDL", "LDLCALC", "LDLDIRECT"   Risk Assessment/Calculations:                Physical Exam:    VS:  BP 114/76   Pulse (!) 54   Ht 6\' 1"  (1.854 m)   Wt 263  lb (119.3 kg)   SpO2 97%   BMI 34.70 kg/m     Wt Readings from Last 3 Encounters:  09/08/22 263 lb (119.3 kg)  09/08/22 263 lb (119.3 kg)  07/19/20 (!) 317 lb 3.9 oz (143.9 kg)     GEN: Well nourished, well developed in no acute distress HEENT: Normal NECK: No JVD; No carotid bruits CARDIAC:Bradycardic, regular, no murmurs, rubs, gallops RESPIRATORY:  Clear to auscultation without rales, wheezing or rhonchi  ABDOMEN: Soft, non-tender, non-distended MUSCULOSKELETAL:  No edema; No deformity; No tenderness of neck/back with palpation  SKIN: Warm and dry NEUROLOGIC:  Alert and oriented x 3 PSYCHIATRIC:  Normal affect   ASSESSMENT:    1. Precordial pain   2. Abnormal ECG   3. Mixed hyperlipidemia    PLAN:    In order of problems listed above:  #Dynamic ECG Changes: #Neck Pain: #Atypical Chest Pain: Patient initially referred to Cardiology clinic for episodes of sharp, intermittent chest pain and left arm numbness. The left arm numbness was made worse by exertion but the chest pain could occur at any time and did not correlate with activity. While these symptoms are atypical in nature, today he presented to clinic with ongoing neck pain. Again, he  states this is not abnormal for him given chronic neck./back pain but is worse today. Initial ECG showed STE in the lateral leads and TWI in the inferior leads which looks significantly different from prior ECGs (last ECG in PCP office in 07/2022). Repeat ECG with improvement in dynamic changes, however, given neck pain, ECG changes and risk factors, the decision was made [facility]. This was discussed with STEMI MD, Dr. Clifton JamesMcAlhany, who agrees with plan. Cards will evaluate on arrival. ED made aware of transfer. -[facility] -Cardiology team aware of transfer; please contact on arrival -Check Trops -Trend ECGs  -Give full dose ASA 325mg  daily -Start heparin gtt for now -Nitro prn -Likely plan for cath pending above work-up  #HLD: LDL very elevated at 189 (? Familial). TG also elevated at 247. Merits cholesterol lowering therapy at this time. Recommend starting crestor. -Start crestor 20mg  daily -Check lipids in 6-8 weeks  Plan discussed at length with patient, his wife, Dr. Clifton JamesMcAlhany and ER physician.  INFORMED CONSENT: I have reviewed the risks, indications, and alternatives to cardiac catheterization, possible angioplasty, and stenting with the patient. Risks include but are not limited to bleeding, infection, vascular injury, stroke, myocardial infection, arrhythmia, kidney injury, radiation-related injury in the case of prolonged fluoroscopy use, emergency cardiac surgery, and death. The patient understands the risks of serious complication is 1-2 in 1000 with diagnostic cardiac cath and 1-2% or less with angioplasty/stenting.            Follow up: Recommend presentation to ER.  Medication Adjustments/Labs and Tests Ordered: Current medicines are reviewed at length with the patient today.  Concerns regarding medicines are outlined above.   Orders Placed This Encounter  Procedures   EKG 12-Lead   No orders of the defined types  were placed in this encounter.  Patient Instructions  Medication Instructions:   Your physician recommends that you continue on your current medications as directed. Please refer to the Current Medication list given to you today.  *If you need a refill on your cardiac medications before your next appointment, please call your pharmacy*    Follow-Up:  THIS WILL BE ARRANGED AT DISCHARGE FROM THE HOSPITAL  Other Instructions  YOU WILL BE GOING STRAIGHT OVER TO [facility] PER DR. Shari Prows FOR CHEST PAIN AND EKG CHANGES--OUR CARDIOLOGY TEAM WILL MEET YOU IN THE ER--YOU WILL BE GOING VIA EMS   Important Information About Sugar         I,Mitra Faeizi,acting as a scribe for Meriam Sprague, MD.,have documented all relevant documentation on the behalf of Meriam Sprague, MD,as directed by  Meriam Sprague, MD while in the presence of Meriam Sprague, MD.   I, Meriam Sprague, MD, have reviewed all documentation for this visit. The documentation on 09/08/22 for the exam, diagnosis, procedures, and orders are all accurate and complete.   Signed, Meriam Sprague, MD  09/08/2022 10:05 AM    [facility]

## 2022-09-08 NOTE — Progress Notes (Signed)
Pt and spouse received d/c verbal instructions. Awaiting written d/c instructions from the PA assigned to pt. All questions and concerns addressed. PIV removed and intact. Pt denies any acute pain or discomfort. Dressing and arm brace remains intact, no s/s of bleeding or hematoma noted.

## 2022-09-09 ENCOUNTER — Encounter (HOSPITAL_COMMUNITY): Payer: Self-pay | Admitting: Cardiovascular Disease

## 2022-09-09 DIAGNOSIS — M5412 Radiculopathy, cervical region: Secondary | ICD-10-CM | POA: Diagnosis not present

## 2022-09-09 DIAGNOSIS — M4802 Spinal stenosis, cervical region: Secondary | ICD-10-CM | POA: Diagnosis not present

## 2022-09-19 DIAGNOSIS — M5412 Radiculopathy, cervical region: Secondary | ICD-10-CM | POA: Diagnosis not present

## 2022-09-19 DIAGNOSIS — M4802 Spinal stenosis, cervical region: Secondary | ICD-10-CM | POA: Diagnosis not present

## 2022-09-22 ENCOUNTER — Ambulatory Visit: Payer: Medicare Other | Admitting: Physician Assistant

## 2022-10-07 ENCOUNTER — Ambulatory Visit: Payer: Medicare Other | Admitting: Neurology

## 2022-10-07 ENCOUNTER — Encounter: Payer: Self-pay | Admitting: Neurology

## 2022-10-07 VITALS — BP 114/72 | HR 59 | Ht 73.0 in | Wt 255.8 lb

## 2022-10-07 DIAGNOSIS — G43009 Migraine without aura, not intractable, without status migrainosus: Secondary | ICD-10-CM | POA: Diagnosis not present

## 2022-10-07 MED ORDER — RIZATRIPTAN BENZOATE 10 MG PO TBDP: 10.0000 mg | ORAL_TABLET | ORAL | 11 refills | Status: AC | PRN

## 2022-10-07 MED ORDER — ONDANSETRON 4 MG PO TBDP: 4.0000 mg | ORAL_TABLET | Freq: Three times a day (TID) | ORAL | 3 refills | Status: AC | PRN

## 2022-10-07 NOTE — Progress Notes (Signed)
[facility]    Provider:  Dr Lucia Gaskins Requesting Provider: Farris Has, MD Primary Care Provider:  Farris Has, MD  CC:  migraines  HPI:  Howard Miller is a 51 y.o. male here as requested by Farris Has, MD for headaches. PMHx cardiac cath, abnomral EKG, cp, ams due to intoxication, 12-18 beers on a weekend, accidental overdose.  I reviewed notes from Molli Hazard, MD, patient complains of migraines, he has a history of migraines, left-sided sudden onset severe headache, he was out in the yard working with leaves and he heard a snap in the head and immediately had pain 8-9 out of 10, this was 2 days prior to last appointment in early November, he did not seek care, this was very much unlike his past migraines, with his migraines he lays down in a dark room, the next day he felt fine, no vision changes, no other concerning symptoms.  He is not sure why he is here. He has concerns about MS but his MRi in 2021 only had a few nonspecific white matter spots (reviewed images with patient). Started having migraines in 20s, no known family history, pulsating/pounding/throbbing, light and sound sensitivity, nausea, hurts to move, has vomited, can be severe and can last an hour treated with tylenol. No unilateral autonomic symptoms. Both side usually. Unknown triggers but last one he was working in the yard he was sweating, whole head behind both eyes. He hasn't had one in 3 years except for the one in November, the one in November the onset was immediate but the symptoms were the same lasted an hour with tylenol. No other focal neurologic deficits, associated symptoms, inciting events or modifiable factors.  Reviewed notes, labs and imaging from outside physicians, which showed:  From a thorough review of records, medications tried that can be used in migraine management include: Tizanidine, diclofenac, Lyrica, Tylenol, aspirin, Celebrex, Flexeril, Frova, gabapentin, labetalol, Zofran,  Phenergan, scopolamine patches.  Labs 08/13/2022 include CMP with glucose 104, BUN 19, creatinine 0.79, otherwise normal, CBC unremarkable, I do not see a thyroid.  Sed rate in the past in June 2023 has been normal at 12  07/17/2020: MRI brain CLINICAL DATA:  Mental status change   EXAM: MRI HEAD WITHOUT CONTRAST   TECHNIQUE: Multiplanar, multiecho pulse sequences of the brain and surrounding structures were obtained without intravenous contrast.   COMPARISON:  07/16/2020 head CT and prior.   FINDINGS: Please note motion artifact limits evaluation.   Brain: No diffusion-weighted signal abnormality. No intracranial hemorrhage. Few scattered T2/FLAIR hyperintense foci involving the right frontal subcortical white matter. No midline shift, ventriculomegaly or extra-axial fluid collection. No mass lesion.   Vascular: Normal flow voids.   Skull and upper cervical spine: Normal marrow signal.   Sinuses/Orbits: Normal orbits. Ethmoid and right maxillary sinus mucosal thickening. No mastoid effusion.   Other: None.   IMPRESSION: No acute intracranial process.   Few nonspecific right frontal white matter FLAIR hyperintensities. Differential includes chronic microvascular ischemic changes, post infectious/inflammatory sequela, migraines and demyelination.     Latest Ref Rng & Units 09/08/2022    9:52 AM 07/19/2020    3:41 AM 07/18/2020    6:58 AM  CMP  Glucose 70 - 99 mg/dL 119  91  95   BUN 6 - 20 mg/dL 12  10  16    Creatinine 0.61 - 1.24 mg/dL  1.47  8.29   Sodium 135 - 145 mmol/L 139  140  138   Potassium 3.5 -  5.1 mmol/L 4.1  3.9  3.6   Chloride 98 - 111 mmol/L 106  106  104   CO2 22 - 32 mmol/L 24  25  25    Calcium 8.9 - 10.3 mg/dL 9.0  8.4  8.1   Total Protein 6.5 - 8.1 g/dL 6.6  5.9  5.7   Total Bilirubin 0.3 - 1.2 mg/dL 0.6  1.0  1.0   Alkaline Phos 38 - 126 U/L 63  55  57   AST 15 - 41 U/L 21  68  88   ALT 0 - 44 U/L 29  50  53       Latest Ref Rng & Units  09/08/2022    9:52 AM 07/19/2020    3:41 AM 07/18/2020    2:49 PM  CBC  WBC 4.0 - 10.5 K/uL 8.2  6.5  8.1   Hemoglobin 13.0 - 17.0 g/dL 07/20/2020  19.6  22.2   Hematocrit 39.0 - 52.0 % 43.9  37.6  38.1   Platelets 150 - 400 K/uL 230  217  214      Review of Systems: Patient complains of symptoms per HPI as well as the following symptoms episodic migraines. Pertinent negatives and positives per HPI. All others negative.   Social History   Socioeconomic History   Marital status: Married    Spouse name: Not on file   Number of children: Not on file   Years of education: Not on file   Highest education level: Not on file  Occupational History   Not on file  Tobacco Use   Smoking status: Never   Smokeless tobacco: Former    Types: 97.9 Use: Never used  Substance and Sexual Activity   Alcohol use: Yes    Comment: social   Drug use: No   Sexual activity: Not on file  Other Topics Concern   Not on file  Social History Narrative   Caffiene (tea every other day)   No Disabled (chronic back pain).    Wife, 2 kids.    Social Determinants of Health   Financial Resource Strain: Not on file  Food Insecurity: Not on file  Transportation Needs: Not on file  Physical Activity: Not on file  Stress: Not on file  Social Connections: Not on file  Intimate Partner Violence: Not on file    Family History  Problem Relation Age of Onset   Cancer Mother    Migraines Neg Hx     Past Medical History:  Diagnosis Date   Bronchitis    DDD (degenerative disc disease), lumbar    low back   Incarcerated umbilical hernia    Migraines    Seasonal allergies     Patient Active Problem List   Diagnosis Date Noted   Migraine without aura and without status migrainosus, not intractable 10/07/2022   Nonspecific abnormal electrocardiogram (ECG) (EKG) 09/08/2022   Chest pain of uncertain etiology 09/08/2022   S/P cardiac cath patent coronary arteries 09/08/2022   Urine  retention    Accidental overdose 07/17/2020   Altered mental status associated with intoxication 07/17/2020   Bronchitis     Past Surgical History:  Procedure Laterality Date   ELBOW SURGERY Left    HAND SURGERY Right    INSERTION OF MESH N/A 08/06/2018   Procedure: INSERTION OF MESH;  Surgeon: 08/08/2018, MD;  Location:  SUR[facility]ERY CENTER;  Service: General;  Laterality: N/A;   LEFT HEART CATH AND  CORONARY ANGIOGRAPHY N/A 09/08/2022   Procedure: LEFT HEART CATH AND CORONARY ANGIOGRAPHY;  Surgeon: Kathleene HazelMcAlhany, Christopher D, MD;  Location: [facility];  Service: Cardiovascular;  Laterality: N/A;   SHOULDER SURGERY Bilateral    UMBILICAL HERNIA REPAIR N/A 08/06/2018   Procedure: UMBILICAL HERNIA REPAIR WITH MESH;  Surgeon: Abigail MiyamotoBlackman, Douglas, MD;  Location:  SURGERY CENTER;  Service: General;  Laterality: N/A;    Current Outpatient Medications  Medication Sig Dispense Refill   acetaminophen (TYLENOL) 500 MG tablet Take 1,000 mg by mouth every 8 (eight) hours as needed for mild pain or headache.     atorvastatin (LIPITOR) 40 MG tablet Take 40 mg by mouth daily.     diclofenac (VOLTAREN) 75 MG EC tablet Take 75 mg by mouth 2 (two) times daily as needed for mild pain or moderate pain.     ondansetron (ZOFRAN-ODT) 4 MG disintegrating tablet Take 1-2 tablets (4-8 mg total) by mouth every 8 (eight) hours as needed. Can take for nausea alone or with Rizatriptan for migraine 30 tablet 3   pregabalin (LYRICA) 75 MG capsule Take 75 mg by mouth 2 (two) times daily.     rizatriptan (MAXALT-MLT) 10 MG disintegrating tablet Take 1 tablet (10 mg total) by mouth as needed for migraine. May repeat in 2 hours if needed. May take with ondansetron to help with nausea 9 tablet 11   tiZANidine (ZANAFLEX) 4 MG tablet Take 2 mg by mouth 3 (three) times daily as needed for muscle spasms.     No current facility-administered medications for this visit.    Allergies as of 10/07/2022    (No Known Allergies)    Vitals: BP 114/72   Pulse (!) 59   Ht 6\' 1"  (1.854 m)   Wt 255 lb 12.8 oz (116 kg)   BMI 33.75 kg/m  Last Weight:  Wt Readings from Last 1 Encounters:  10/07/22 255 lb 12.8 oz (116 kg)   Last Height:   Ht Readings from Last 1 Encounters:  10/07/22 6\' 1"  (1.854 m)     Physical exam: Exam: Gen: NAD, conversant, well nourised, obese, well groomed                     CV: RRR, no MRG. No Carotid Bruits. No peripheral edema, warm, nontender Eyes: Conjunctivae clear without exudates or hemorrhage  Neuro: Detailed Neurologic Exam  Speech:    Speech is normal; fluent and spontaneous with normal comprehension.  Cognition:    The patient is oriented to person, place, and time;     recent and remote memory intact;     language fluent;     normal attention, concentration,     fund of knowledge Cranial Nerves:    The pupils are equal, round, and reactive to light. The fundi are without ONH edema. Visual fields are full to finger confrontation. Extraocular movements are intact. Trigeminal sensation is intact and the muscles of mastication are normal. The face is symmetric. The palate elevates in the midline. Hearing intact. Voice is normal. Shoulder shrug is normal. The tongue has normal motion without fasciculations.   Coordination:    Normal finger to nose and heel to shin. Normal rapid alternating movements.   Gait: nml  Motor Observation:    No asymmetry, no atrophy, and no involuntary movements noted. Tone:    Normal muscle tone.    Posture:    Posture is normal. normal erect    Strength: weakness right leg due to  sciatica he states chronic. Othrewise    Strength is V/V in the upper and lower limbs.      Sensation: intact to LT     Reflex Exam:  DTR's:    Deep tendon reflexes in the upper and lower extremities are 1+ AJs, patellars and biceos bilaterally.   Toes:    The toes are downgoing bilaterally.   Clonus:    Clonus is absent.     Assessment/Plan:  Patient with episodic migraines. The last one he heard a "pop" in his head but the symptoms were the same and prior to this one in November he hadn't had a migraine for months. He declines any further workup including MRI.  Has heart disease (hx of stemi, left heart cath,chest pain, abnormal EKG) but 09/08/2022 note states "No angiographic evidence of CAD " so triptans would be ok. At onset of migraine take rizatriptan and ondansetron.   Discussed: To prevent or relieve headaches, try the following: Cool Compress. Lie down and place a cool compress on your head.  Avoid headache triggers. If certain foods or odors seem to have triggered your migraines in the past, avoid them. A headache diary might help you identify triggers.  Include physical activity in your daily routine. Try a daily walk or other moderate aerobic exercise.  Manage stress. Find healthy ways to cope with the stressors, such as delegating tasks on your to-do list.  Practice relaxation techniques. Try deep breathing, yoga, massage and visualization.  Eat regularly. Eating regularly scheduled meals and maintaining a healthy diet might help prevent headaches. Also, drink plenty of fluids.  Follow a regular sleep schedule. Sleep deprivation might contribute to headaches Consider biofeedback. With this mind-body technique, you learn to control certain bodily functions -- such as muscle tension, heart rate and blood pressure -- to prevent headaches or reduce headache pain.    Proceed to emergency room if you experience new or worsening symptoms or symptoms do not resolve, if you have new neurologic symptoms or if headache is severe, or for any concerning symptom.   Provided education and documentation from [employer] headache [employer] toolbox including articles on: chronic migraine medication overuse headache, chronic migraines, prevention of migraines, behavioral and other nonpharmacologic treatments for  headache.    No orders of the defined types were placed in this encounter.  Meds ordered this encounter  Medications   rizatriptan (MAXALT-MLT) 10 MG disintegrating tablet    Sig: Take 1 tablet (10 mg total) by mouth as needed for migraine. May repeat in 2 hours if needed. May take with ondansetron to help with nausea    Dispense:  9 tablet    Refill:  11   ondansetron (ZOFRAN-ODT) 4 MG disintegrating tablet    Sig: Take 1-2 tablets (4-8 mg total) by mouth every 8 (eight) hours as needed. Can take for nausea alone or with Rizatriptan for migraine    Dispense:  30 tablet    Refill:  3    Cc: Farris Has, MD,  Farris Has, MD  Naomie Dean, MD  [facility] [address] [place], [place] [address]  Phone [phone] Fax [phone]

## 2022-10-07 NOTE — Patient Instructions (Signed)
Migraines AT [place]: Take tylenol can also take Rizatriptan with Ondansetron.   Rizatriptan Disintegrating Tablets What is this medication? RIZATRIPTAN (rye za TRIP tan) treats migraines. It works by blocking pain signals and narrowing blood vessels in the brain. It belongs to a group of medications called triptans. It is not used to prevent migraines. This medicine may be used for other purposes; ask your health care provider or pharmacist if you have questions. COMMON BRAND NAME(S): Maxalt-MLT What should I tell my care team before I take this medication? They need to know if you have any of these conditions: Circulation problems in fingers and toes Diabetes Heart disease High blood pressure High cholesterol History of irregular heartbeat History of stroke Stomach or intestine problems Tobacco use An unusual or allergic reaction to rizatriptan, other medications, foods, dyes, or preservatives Pregnant or trying to get pregnant Breast-feeding How should I use this medication? Take this medication by mouth. Take it as directed on the prescription label. You do not need water to take this medication. Leave the tablet in the sealed pack until you are ready to take it. With dry hands, open the pack and gently remove the tablet. If the tablet breaks or crumbles, throw it away. Use a new tablet. Place the tablet on the tongue and allow it to dissolve. Then, swallow it. Do not cut, crush, or chew this medication. Do not use it more often than directed. Talk to your care team about the use of this medication in children. While it may be prescribed for children as young as 6 years for selected conditions, precautions do apply. Overdosage: If you think you have taken too much of this medicine contact a poison control center or emergency room at once. NOTE: This medicine is only for you. Do not share this medicine with others. What if I miss a dose? This does not apply. This medication is not for  regular use. What may interact with this medication? Do not take this medication with any of the following: Ergot alkaloids, such as dihydroergotamine, ergotamine MAOIs, such as Marplan, Nardil, Parnate Other medications for migraine headache, such as almotriptan, eletriptan, frovatriptan, naratriptan, sumatriptan, zolmitriptan This medication may also interact with the following: Certain medications for depression, anxiety, or other mental health conditions Propranolol This list may not describe all possible interactions. Give your health care provider a list of all the medicines, herbs, non-prescription drugs, or dietary supplements you use. Also tell them if you smoke, drink alcohol, or use illegal drugs. Some items may interact with your medicine. What should I watch for while using this medication? Visit your care team for regular checks on your progress. Tell your care team if your symptoms do not start to get better or if they get worse. This medication may affect your coordination, reaction time, or judgment. Do not drive or operate machinery until you know how this medication affects you. Sit up or stand slowly to reduce the risk of dizzy or fainting spells. If you take migraine medications for 10 or more days a month, your migraines may get worse. Keep a diary of headache days and medication use. Contact your care team if your migraine attacks occur more frequently. What side effects may I notice from receiving this medication? Side effects that you should report to your care team as soon as possible: Allergic reactions--skin rash, itching, hives, swelling of the face, lips, tongue, or throat Burning, pain, tingling, or color changes in the hands, arms, legs, or feet Heart attack--pain  or tightness in the chest, shoulders, arms, or jaw, nausea, shortness of breath, cold or clammy skin, feeling faint or lightheaded Heart rhythm changes--fast or irregular heartbeat, dizziness, feeling  faint or lightheaded, chest pain, trouble breathing Increase in blood pressure Irritability, confusion, fast or irregular heartbeat, muscle stiffness, twitching muscles, sweating, high fever, seizure, chills, vomiting, diarrhea, which may be signs of serotonin syndrome Raynaud syndrome--cool, numb, or painful fingers or toes that may change color from pale, to blue, to red Seizures Stroke--sudden numbness or weakness of the face, arm, or leg, trouble speaking, confusion, trouble walking, loss of balance or coordination, dizziness, severe headache, change in vision Sudden or severe stomach pain, bloody diarrhea, fever, nausea, vomiting Vision loss Side effects that usually do not require medical attention (report to your care team if they continue or are bothersome): Dizziness Unusual weakness or fatigue This list may not describe all possible side effects. Call your doctor for medical advice about side effects. You may report side effects to FDA at 1-800-FDA-1088. Where should I keep my medication? Keep out of the reach of children and pets. Store at room temperature between 15 and 30 degrees C (59 and 86 degrees F). Protect from light and moisture. Get rid of any unused medication after the expiration date. To get rid of medications that are no longer needed or have expired: Take the medication to a medication take-back program. Check with your pharmacy or law enforcement to find a location. If you cannot return the medication, check the label or package insert to see if the medication should be thrown out in the garbage or flushed down the toilet. If you are not sure, ask your care team. If it is safe to put it in the trash, empty the medication out of the container. Mix the medication with cat litter, dirt, coffee grounds, or other unwanted substance. Seal the mixture in a bag or container. Put it in the trash. NOTE: This sheet is a summary. It may not cover all possible information. If you have  questions about this medicine, talk to your doctor, pharmacist, or health care provider.  2023 Elsevier/Gold Standard (2022-02-06 00:00:00) Ondansetron Dissolving Tablets What is this medication? ONDANSETRON (on DAN se tron) prevents nausea and vomiting from chemotherapy, radiation, or surgery. It works by blocking substances in the body that may cause nausea or vomiting. It belongs to a group of medications called antiemetics. This medicine may be used for other purposes; ask your health care provider or pharmacist if you have questions. COMMON BRAND NAME(S): Zofran ODT What should I tell my care team before I take this medication? They need to know if you have any of these conditions: Heart disease History of irregular heartbeat Liver disease Low levels of magnesium or potassium in the blood An unusual or allergic reaction to ondansetron, granisetron, other medications, foods, dyes, or preservatives Pregnant or trying to get pregnant Breast-feeding How should I use this medication? These tablets are made to dissolve in the mouth. Do not try to push the tablet through the foil backing. With dry hands, peel away the foil backing and gently remove the tablet. Place the tablet in the mouth and allow it to dissolve, then swallow. While you may take these tablets with water, it is not necessary to do so. Talk to your care team regarding the use of this medication in children. Special care may be needed. Overdosage: If you think you have taken too much of this medicine contact a poison control center or  emergency room at once. NOTE: This medicine is only for you. Do not share this medicine with others. What if I miss a dose? If you miss a dose, take it as soon as you can. If it is almost time for your next dose, take only that dose. Do not take double or extra doses. What may interact with this medication? Do not take this medication with any of the following: Apomorphine Certain medications  for fungal infections like fluconazole, itraconazole, ketoconazole, posaconazole, voriconazole Cisapride Dronedarone Pimozide Thioridazine This medication may also interact with the following: Carbamazepine Certain medications for depression, anxiety, or psychotic disturbances Fentanyl Linezolid MAOIs like Carbex, Eldepryl, Marplan, Nardil, and Parnate Methylene blue (injected into a vein) Other medications that prolong the QT interval (cause an abnormal heart rhythm) like dofetilide, ziprasidone Phenytoin Rifampicin Tramadol This list may not describe all possible interactions. Give your health care provider a list of all the medicines, herbs, non-prescription drugs, or dietary supplements you use. Also tell them if you smoke, drink alcohol, or use illegal drugs. Some items may interact with your medicine. What should I watch for while using this medication? Check with your care team as soon as you can if you have any sign of an allergic reaction. What side effects may I notice from receiving this medication? Side effects that you should report to your care team as soon as possible: Allergic reactions--skin rash, itching, hives, swelling of the face, lips, tongue, or throat Bowel blockage--stomach cramping, unable to have a bowel movement or pass gas, loss of appetite, vomiting Chest pain (angina)--pain, pressure, or tightness in the chest, neck, back, or arms Heart rhythm changes--fast or irregular heartbeat, dizziness, feeling faint or lightheaded, chest pain, trouble breathing Irritability, confusion, fast or irregular heartbeat, muscle stiffness, twitching muscles, sweating, high fever, seizure, chills, vomiting, diarrhea, which may be signs of serotonin syndrome Side effects that usually do not require medical attention (report to your care team if they continue or are bothersome): Constipation Diarrhea General discomfort and fatigue Headache This list may not describe all  possible side effects. Call your doctor for medical advice about side effects. You may report side effects to FDA at 1-800-FDA-1088. Where should I keep my medication? Keep out of the reach of children and pets. Store between 2 and 30 degrees C (36 and 86 degrees F). Throw away any unused medication after the expiration date. NOTE: This sheet is a summary. It may not cover all possible information. If you have questions about this medicine, talk to your doctor, pharmacist, or health care provider.  2023 Elsevier/Gold Standard (2007-11-27 00:00:00)

## 2022-10-15 DIAGNOSIS — M4802 Spinal stenosis, cervical region: Secondary | ICD-10-CM | POA: Diagnosis not present

## 2022-10-15 DIAGNOSIS — M5412 Radiculopathy, cervical region: Secondary | ICD-10-CM | POA: Diagnosis not present

## 2022-10-20 IMAGING — MR MR LUMBAR SPINE W/O CM
4 of 5 series · 24 of 48 positions shown · non-contrast
Comparison: 11/08/2017.

CLINICAL DATA: Lower back pain radiating to legs bilaterally.

EXAM:
MRI LUMBAR SPINE WITHOUT CONTRAST
TECHNIQUE: Multiplanar, multisequence MR imaging of the lumbar spine was
performed. No intravenous contrast was administered.

[Series 3: T2 · sagittal · 4.0mm · 0.59mm/px · 6 of 18 slices shown (1 of 2)]
[im 1/18]
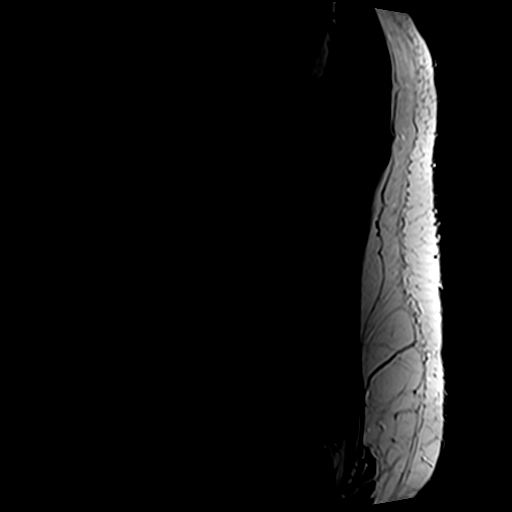
[im 4/18]
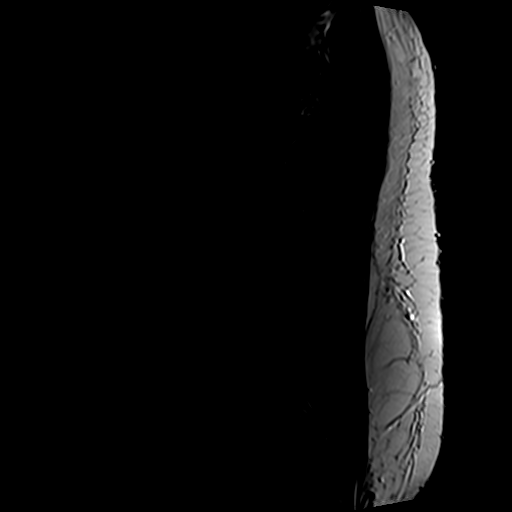
[im 7/18]
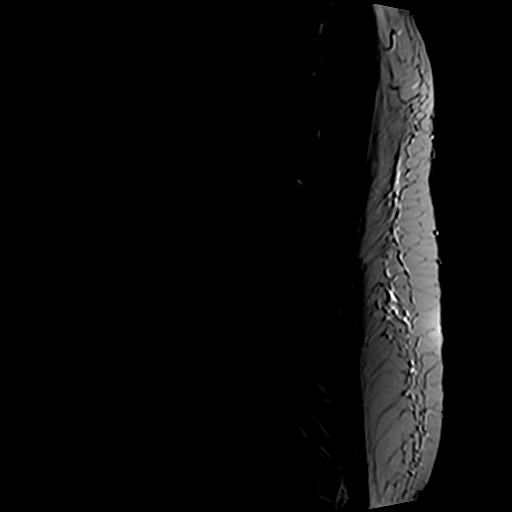
[im 11/18]
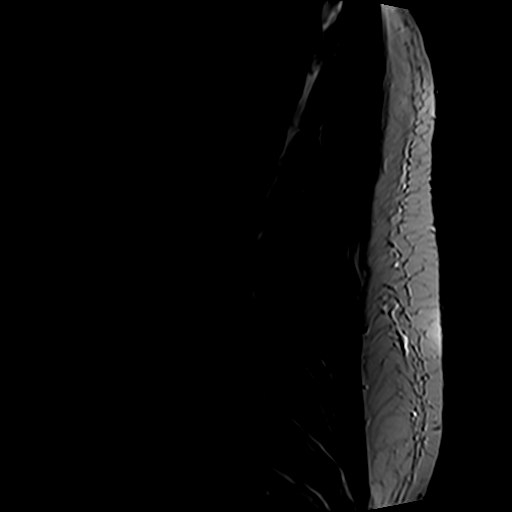
[im 14/18]
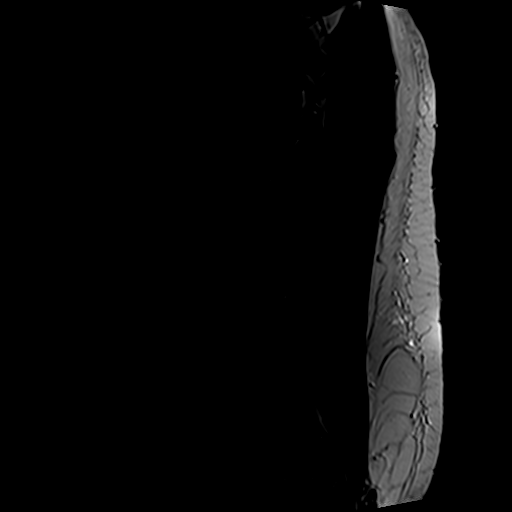
[im 18/18]
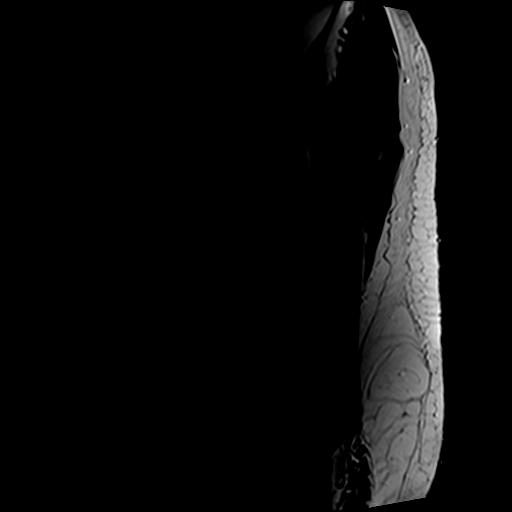

[Series 5: T1 · sagittal · 4.0mm · 0.59mm/px · 7 of 18 slices shown (1 of 2)]
[im 1/18]
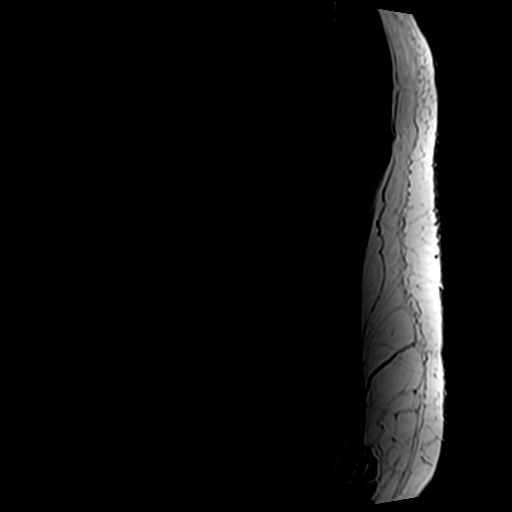
[im 3/18]
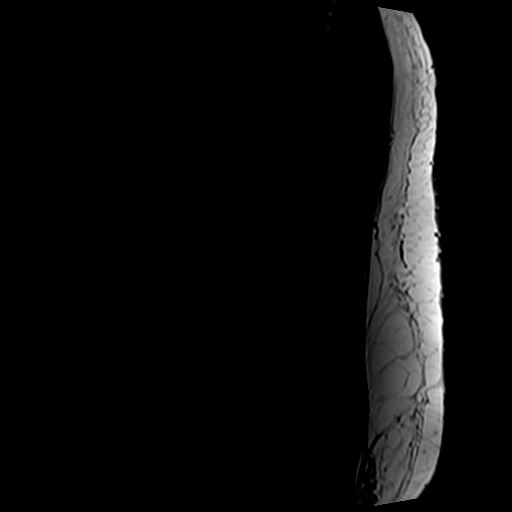
[im 6/18]
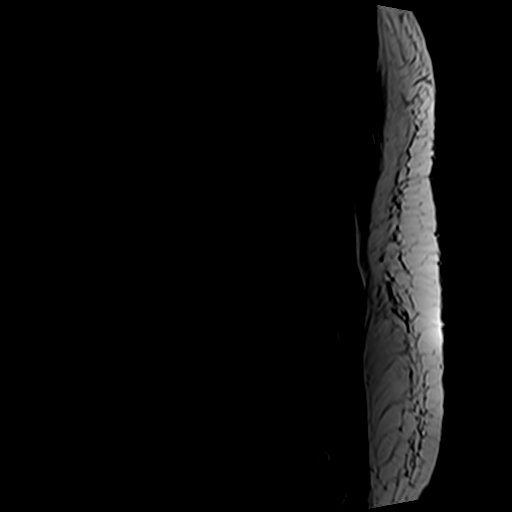
[im 9/18]
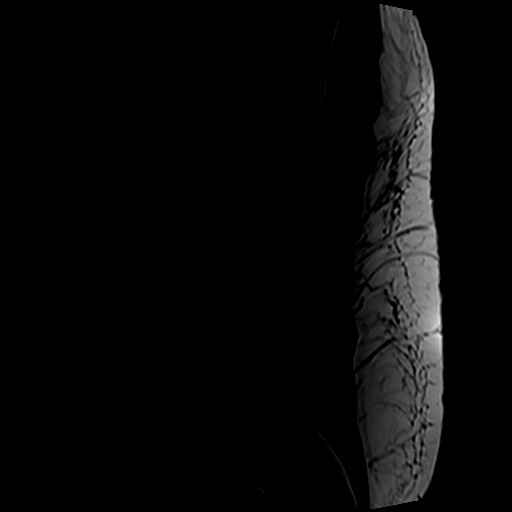
[im 12/18]
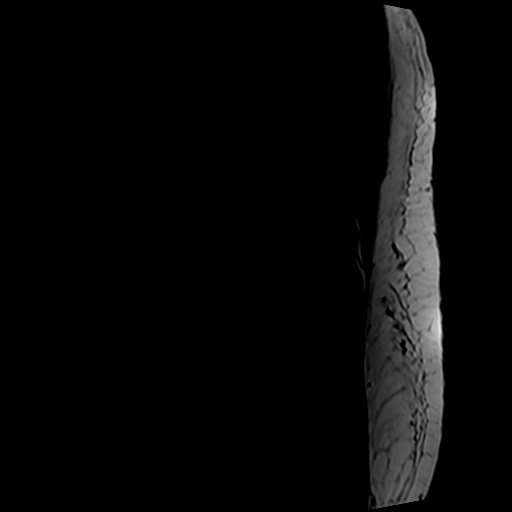
[im 15/18]
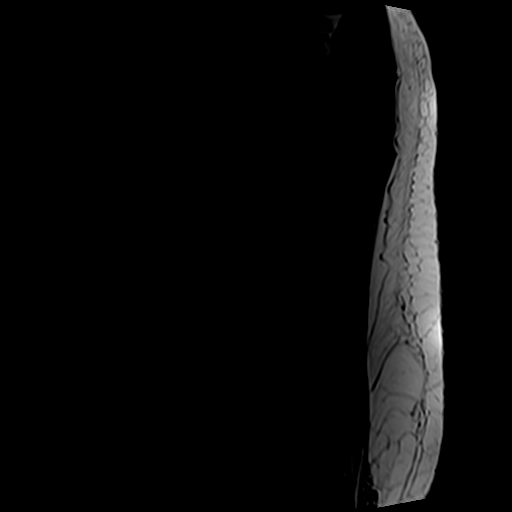
[im 18/18]
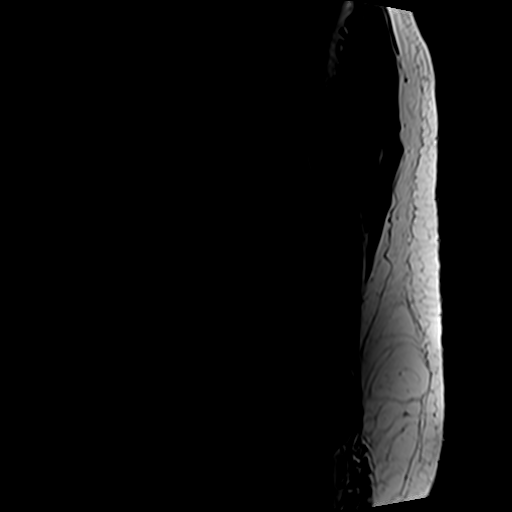

[Series 6: T2 · axial · 4.0mm · 0.70mm/px · z∈[-70,+162]mm · 8 of 38 slices shown (2 of 2)]
[im 1/38]
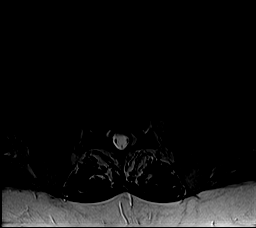
[im 6/38]
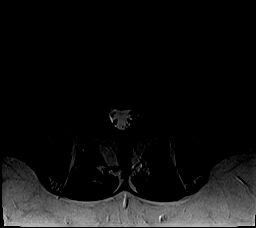
[im 12/38]
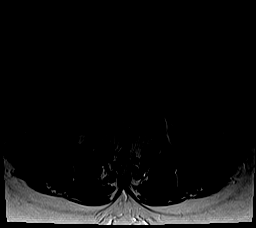
[im 18/38]
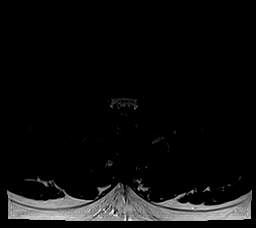
[im 20/38]
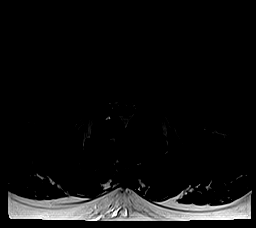
[im 26/38]
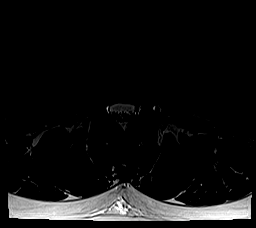
[im 32/38]
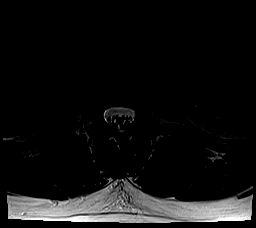
[im 38/38]
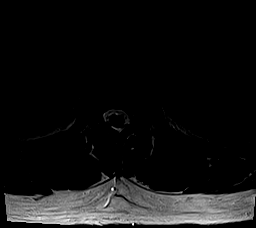

[Series 7: T1 · axial · 4.0mm · 0.35mm/px · z∈[-44,+132]mm · 3 of 38 slices shown (2 of 2)]
[im 6/38]
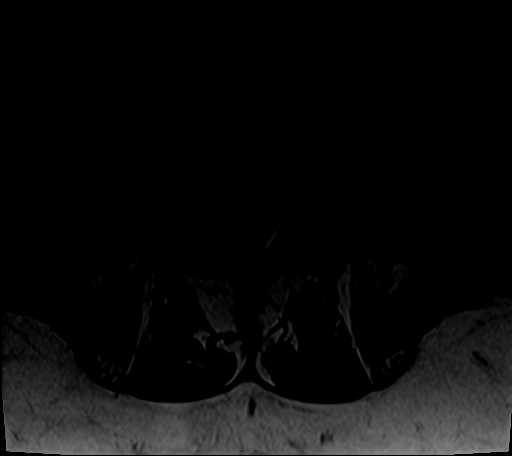
[im 20/38]
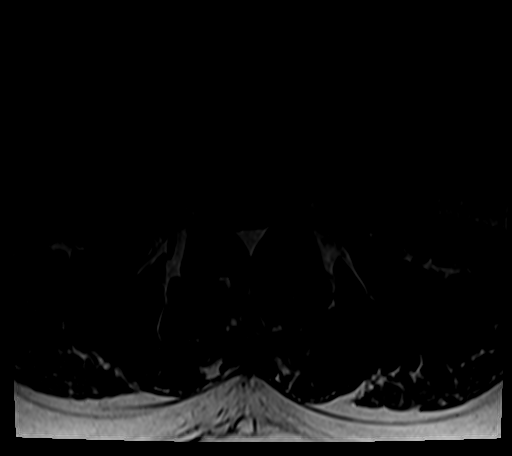
[im 32/38]
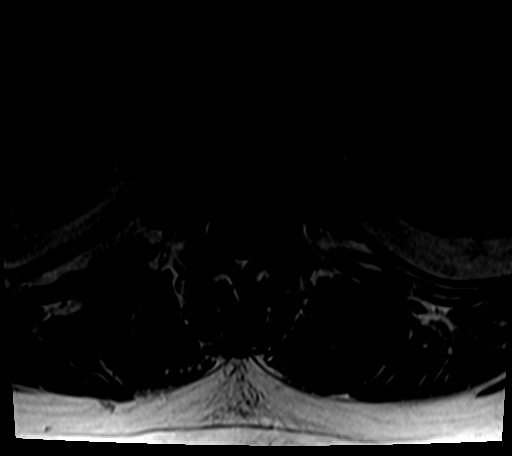

[24 of 48 positions shown; findings below may reference images not displayed]

FINDINGS: Segmentation:  Standard segmentation.

Alignment:  Normal.

Vertebrae: Vertebral body heights are maintained.
Discogenic/degenerative endplate signal changes at L5-S1. No
specific evidence of acute fracture or discitis/osteomyelitis.

Conus medullaris and cauda equina: Conus extends to the L1 level.
Conus appears normal.

Paraspinal and other soft tissues: Unremarkable.

Disc levels:

T12-L1: No significant disc protrusion, foraminal stenosis, or canal
stenosis.

L1-L2: Mild disc bulging and mild bilateral facet hypertrophy
without significant canal or foraminal stenosis.

L2-L3: Broad disc bulge with small central annular fissure. Mild
bilateral facet hypertrophy with ligamentum flavum thickening.
Prominent dorsal epidural fat. Resulting mild canal stenosis, mildly
progressed. No significant foraminal stenosis.

L3-L4: Slight disc bulging and mild bilateral facet hypertrophy.
Prominent dorsal epidural fat. Mild canal stenosis at the disc
level, slightly progressed.

L4-L5: At the mid to lower L4 level there is increased dorsal and
ventral epidural fat (see series 5, image 9) with resulting moderate
canal stenosis, progressed from prior.

At the disc level, broad disc bulge with moderate bilateral facet
hypertrophy and prominent dorsal epidural fat. The previously seen
central disc protrusion has decreased slightly in size. REsulting
overal slightly progressive mild to moderate canal stenosis at the
disc level. Mild-to-moderate bilateral foraminal stenosis, mildly
progressed.

L5-S1: Moderate bilateral facet hypertrophy with mild to moderate
bilateral foraminal stenosis, mildly progressed. No significant
canal stenosis.
IMPRESSION: 1. At the mid to inferior L4 level there is increased dorsal and
ventral epidural lipomatosis which results in moderate canal
stenosis, progressed from prior. At the L4-L5 disc level the
previously seen disc protrusion has decreased in size slightly;
however, there is slightly increased dorsal epidural fat with
resulting overall slightly progressive mild to moderate canal
stenosis. Mild-to-moderate bilateral foraminal stenosis at this
level.
2. Increased epidural lipomatosis at other lumbar levels with
progressive mild canal stenosis at L2-L3 and L3-L4.
3. Mild to moderate bilateral foraminal stenosis at L4-L5 and L5-S1,
mildly progressed.

## 2022-11-24 DIAGNOSIS — M5412 Radiculopathy, cervical region: Secondary | ICD-10-CM | POA: Diagnosis not present

## 2022-12-16 DIAGNOSIS — M5412 Radiculopathy, cervical region: Secondary | ICD-10-CM | POA: Diagnosis not present

## 2023-01-16 ENCOUNTER — Other Ambulatory Visit: Payer: Self-pay | Admitting: Surgical

## 2023-03-05 DIAGNOSIS — J45909 Unspecified asthma, uncomplicated: Secondary | ICD-10-CM | POA: Diagnosis not present

## 2023-03-05 DIAGNOSIS — Z1211 Encounter for screening for malignant neoplasm of colon: Secondary | ICD-10-CM | POA: Diagnosis not present

## 2023-03-05 DIAGNOSIS — Z23 Encounter for immunization: Secondary | ICD-10-CM | POA: Diagnosis not present

## 2023-03-05 DIAGNOSIS — Z0000 Encounter for general adult medical examination without abnormal findings: Secondary | ICD-10-CM | POA: Diagnosis not present

## 2023-03-05 DIAGNOSIS — F322 Major depressive disorder, single episode, severe without psychotic features: Secondary | ICD-10-CM | POA: Diagnosis not present

## 2023-03-05 DIAGNOSIS — E785 Hyperlipidemia, unspecified: Secondary | ICD-10-CM | POA: Diagnosis not present

## 2023-03-05 DIAGNOSIS — Z125 Encounter for screening for malignant neoplasm of prostate: Secondary | ICD-10-CM | POA: Diagnosis not present

## 2023-05-25 DIAGNOSIS — M545 Low back pain, unspecified: Secondary | ICD-10-CM | POA: Diagnosis not present

## 2023-06-10 DIAGNOSIS — M48061 Spinal stenosis, lumbar region without neurogenic claudication: Secondary | ICD-10-CM | POA: Diagnosis not present

## 2023-07-01 DIAGNOSIS — Z6829 Body mass index (BMI) 29.0-29.9, adult: Secondary | ICD-10-CM | POA: Diagnosis not present

## 2023-07-01 DIAGNOSIS — M5412 Radiculopathy, cervical region: Secondary | ICD-10-CM | POA: Diagnosis not present

## 2023-07-06 DIAGNOSIS — M48061 Spinal stenosis, lumbar region without neurogenic claudication: Secondary | ICD-10-CM | POA: Diagnosis not present

## 2023-07-10 DIAGNOSIS — M5412 Radiculopathy, cervical region: Secondary | ICD-10-CM | POA: Diagnosis not present

## 2023-07-16 DIAGNOSIS — M5412 Radiculopathy, cervical region: Secondary | ICD-10-CM | POA: Diagnosis not present

## 2023-07-31 DIAGNOSIS — M5412 Radiculopathy, cervical region: Secondary | ICD-10-CM | POA: Diagnosis not present

## 2023-08-03 DIAGNOSIS — M5412 Radiculopathy, cervical region: Secondary | ICD-10-CM | POA: Diagnosis not present

## 2023-08-05 DIAGNOSIS — M5412 Radiculopathy, cervical region: Secondary | ICD-10-CM | POA: Diagnosis not present

## 2023-08-27 DIAGNOSIS — H353131 Nonexudative age-related macular degeneration, bilateral, early dry stage: Secondary | ICD-10-CM | POA: Diagnosis not present

## 2023-08-27 DIAGNOSIS — H524 Presbyopia: Secondary | ICD-10-CM | POA: Diagnosis not present

## 2023-08-27 DIAGNOSIS — H5203 Hypermetropia, bilateral: Secondary | ICD-10-CM | POA: Diagnosis not present

## 2023-08-27 DIAGNOSIS — H2513 Age-related nuclear cataract, bilateral: Secondary | ICD-10-CM | POA: Diagnosis not present

## 2023-08-27 DIAGNOSIS — H52223 Regular astigmatism, bilateral: Secondary | ICD-10-CM | POA: Diagnosis not present

## 2023-12-29 DIAGNOSIS — M5412 Radiculopathy, cervical region: Secondary | ICD-10-CM | POA: Diagnosis not present

## 2023-12-29 DIAGNOSIS — M48061 Spinal stenosis, lumbar region without neurogenic claudication: Secondary | ICD-10-CM | POA: Diagnosis not present

## 2023-12-29 DIAGNOSIS — M5416 Radiculopathy, lumbar region: Secondary | ICD-10-CM | POA: Diagnosis not present

## 2024-01-04 DIAGNOSIS — M545 Low back pain, unspecified: Secondary | ICD-10-CM | POA: Diagnosis not present

## 2024-02-09 DIAGNOSIS — M5416 Radiculopathy, lumbar region: Secondary | ICD-10-CM | POA: Diagnosis not present

## 2024-02-09 DIAGNOSIS — M48061 Spinal stenosis, lumbar region without neurogenic claudication: Secondary | ICD-10-CM | POA: Diagnosis not present

## 2024-02-09 DIAGNOSIS — M5412 Radiculopathy, cervical region: Secondary | ICD-10-CM | POA: Diagnosis not present

## 2024-02-26 DIAGNOSIS — M5412 Radiculopathy, cervical region: Secondary | ICD-10-CM | POA: Diagnosis not present

## 2024-03-11 DIAGNOSIS — M5416 Radiculopathy, lumbar region: Secondary | ICD-10-CM | POA: Diagnosis not present

## 2024-03-19 DIAGNOSIS — J45909 Unspecified asthma, uncomplicated: Secondary | ICD-10-CM | POA: Diagnosis not present

## 2024-03-19 DIAGNOSIS — F322 Major depressive disorder, single episode, severe without psychotic features: Secondary | ICD-10-CM | POA: Diagnosis not present

## 2024-03-19 DIAGNOSIS — E785 Hyperlipidemia, unspecified: Secondary | ICD-10-CM | POA: Diagnosis not present

## 2024-03-22 DIAGNOSIS — J45909 Unspecified asthma, uncomplicated: Secondary | ICD-10-CM | POA: Diagnosis not present

## 2024-03-22 DIAGNOSIS — F322 Major depressive disorder, single episode, severe without psychotic features: Secondary | ICD-10-CM | POA: Diagnosis not present

## 2024-03-22 DIAGNOSIS — E559 Vitamin D deficiency, unspecified: Secondary | ICD-10-CM | POA: Diagnosis not present

## 2024-03-22 DIAGNOSIS — E785 Hyperlipidemia, unspecified: Secondary | ICD-10-CM | POA: Diagnosis not present

## 2024-03-22 DIAGNOSIS — K5900 Constipation, unspecified: Secondary | ICD-10-CM | POA: Diagnosis not present

## 2024-03-22 DIAGNOSIS — Z125 Encounter for screening for malignant neoplasm of prostate: Secondary | ICD-10-CM | POA: Diagnosis not present

## 2024-03-22 DIAGNOSIS — Z0000 Encounter for general adult medical examination without abnormal findings: Secondary | ICD-10-CM | POA: Diagnosis not present

## 2024-04-05 DIAGNOSIS — M5412 Radiculopathy, cervical region: Secondary | ICD-10-CM | POA: Diagnosis not present

## 2024-04-05 DIAGNOSIS — M5416 Radiculopathy, lumbar region: Secondary | ICD-10-CM | POA: Diagnosis not present

## 2024-04-05 DIAGNOSIS — M48061 Spinal stenosis, lumbar region without neurogenic claudication: Secondary | ICD-10-CM | POA: Diagnosis not present

## 2024-04-18 DIAGNOSIS — J45909 Unspecified asthma, uncomplicated: Secondary | ICD-10-CM | POA: Diagnosis not present

## 2024-04-18 DIAGNOSIS — E785 Hyperlipidemia, unspecified: Secondary | ICD-10-CM | POA: Diagnosis not present

## 2024-04-18 DIAGNOSIS — F322 Major depressive disorder, single episode, severe without psychotic features: Secondary | ICD-10-CM | POA: Diagnosis not present

## 2024-05-19 DIAGNOSIS — J45909 Unspecified asthma, uncomplicated: Secondary | ICD-10-CM | POA: Diagnosis not present

## 2024-05-19 DIAGNOSIS — F322 Major depressive disorder, single episode, severe without psychotic features: Secondary | ICD-10-CM | POA: Diagnosis not present

## 2024-05-19 DIAGNOSIS — E785 Hyperlipidemia, unspecified: Secondary | ICD-10-CM | POA: Diagnosis not present

## 2024-06-19 DIAGNOSIS — J45909 Unspecified asthma, uncomplicated: Secondary | ICD-10-CM | POA: Diagnosis not present

## 2024-06-19 DIAGNOSIS — E785 Hyperlipidemia, unspecified: Secondary | ICD-10-CM | POA: Diagnosis not present

## 2024-06-19 DIAGNOSIS — F322 Major depressive disorder, single episode, severe without psychotic features: Secondary | ICD-10-CM | POA: Diagnosis not present

## 2024-07-11 DIAGNOSIS — M48061 Spinal stenosis, lumbar region without neurogenic claudication: Secondary | ICD-10-CM | POA: Diagnosis not present

## 2024-07-29 DIAGNOSIS — M5412 Radiculopathy, cervical region: Secondary | ICD-10-CM | POA: Diagnosis not present

## 2024-08-08 DIAGNOSIS — Z1211 Encounter for screening for malignant neoplasm of colon: Secondary | ICD-10-CM | POA: Diagnosis not present

## 2024-08-10 DIAGNOSIS — F322 Major depressive disorder, single episode, severe without psychotic features: Secondary | ICD-10-CM | POA: Diagnosis not present

## 2024-08-10 DIAGNOSIS — E785 Hyperlipidemia, unspecified: Secondary | ICD-10-CM | POA: Diagnosis not present

## 2024-08-10 DIAGNOSIS — N529 Male erectile dysfunction, unspecified: Secondary | ICD-10-CM | POA: Diagnosis not present

## 2024-08-10 DIAGNOSIS — M549 Dorsalgia, unspecified: Secondary | ICD-10-CM | POA: Diagnosis not present

## 2024-08-29 DIAGNOSIS — M5412 Radiculopathy, cervical region: Secondary | ICD-10-CM | POA: Diagnosis not present

## 2024-09-22 DIAGNOSIS — K219 Gastro-esophageal reflux disease without esophagitis: Secondary | ICD-10-CM | POA: Diagnosis not present

## 2024-09-22 DIAGNOSIS — R195 Other fecal abnormalities: Secondary | ICD-10-CM | POA: Diagnosis not present

## 2024-09-22 DIAGNOSIS — K5900 Constipation, unspecified: Secondary | ICD-10-CM | POA: Diagnosis not present

## 2024-09-29 DIAGNOSIS — K5730 Diverticulosis of large intestine without perforation or abscess without bleeding: Secondary | ICD-10-CM | POA: Diagnosis not present

## 2024-09-29 DIAGNOSIS — K640 First degree hemorrhoids: Secondary | ICD-10-CM | POA: Diagnosis not present

## 2024-09-29 DIAGNOSIS — Z1211 Encounter for screening for malignant neoplasm of colon: Secondary | ICD-10-CM | POA: Diagnosis not present

## 2024-09-29 DIAGNOSIS — K635 Polyp of colon: Secondary | ICD-10-CM | POA: Diagnosis not present

## 2024-09-29 DIAGNOSIS — D128 Benign neoplasm of rectum: Secondary | ICD-10-CM | POA: Diagnosis not present

## 2024-09-29 DIAGNOSIS — R195 Other fecal abnormalities: Secondary | ICD-10-CM | POA: Diagnosis not present

## 2024-10-07 DIAGNOSIS — E785 Hyperlipidemia, unspecified: Secondary | ICD-10-CM | POA: Diagnosis not present
